# Patient Record
Sex: Female | Born: 1950
Health system: Southern US, Community
[De-identification: ages and names within clinical notes are randomized; demographics above are authoritative.]

## PROBLEM LIST (undated history)

## (undated) DIAGNOSIS — R0683 Snoring: Secondary | ICD-10-CM

## (undated) DIAGNOSIS — E039 Hypothyroidism, unspecified: Secondary | ICD-10-CM

## (undated) DIAGNOSIS — Z8249 Family history of ischemic heart disease and other diseases of the circulatory system: Secondary | ICD-10-CM

## (undated) DIAGNOSIS — M1990 Unspecified osteoarthritis, unspecified site: Secondary | ICD-10-CM

## (undated) DIAGNOSIS — E785 Hyperlipidemia, unspecified: Secondary | ICD-10-CM

## (undated) DIAGNOSIS — R9439 Abnormal result of other cardiovascular function study: Secondary | ICD-10-CM

## (undated) DIAGNOSIS — E041 Nontoxic single thyroid nodule: Secondary | ICD-10-CM

## (undated) DIAGNOSIS — K219 Gastro-esophageal reflux disease without esophagitis: Secondary | ICD-10-CM

## (undated) DIAGNOSIS — E119 Type 2 diabetes mellitus without complications: Secondary | ICD-10-CM

## (undated) DIAGNOSIS — H409 Unspecified glaucoma: Secondary | ICD-10-CM

## (undated) DIAGNOSIS — M8580 Other specified disorders of bone density and structure, unspecified site: Secondary | ICD-10-CM

## (undated) DIAGNOSIS — J45909 Unspecified asthma, uncomplicated: Secondary | ICD-10-CM

## (undated) DIAGNOSIS — G4734 Idiopathic sleep related nonobstructive alveolar hypoventilation: Principal | ICD-10-CM

## (undated) DIAGNOSIS — R0789 Other chest pain: Secondary | ICD-10-CM

## (undated) DIAGNOSIS — Z8589 Personal history of malignant neoplasm of other organs and systems: Secondary | ICD-10-CM

## (undated) DIAGNOSIS — R9431 Abnormal electrocardiogram [ECG] [EKG]: Secondary | ICD-10-CM

## (undated) DIAGNOSIS — E079 Disorder of thyroid, unspecified: Secondary | ICD-10-CM

## (undated) HISTORY — PX: KNEE ARTHROSCOPY: SUR90

## (undated) HISTORY — PX: WRIST SURGERY: SHX841

## (undated) HISTORY — DX: Unspecified glaucoma: H40.9

## (undated) HISTORY — PX: TUBAL LIGATION: SHX77

## (undated) HISTORY — DX: Hyperlipidemia, unspecified: E78.5

## (undated) HISTORY — DX: Family history of ischemic heart disease and other diseases of the circulatory system: Z82.49

## (undated) HISTORY — PX: REDUCTION MAMMAPLASTY: SUR839

## (undated) HISTORY — DX: Other specified disorders of bone density and structure, unspecified site: M85.80

## (undated) HISTORY — DX: Snoring: R06.83

## (undated) HISTORY — DX: Hypothyroidism, unspecified: E03.9

## (undated) HISTORY — DX: Nontoxic single thyroid nodule: E04.1

## (undated) HISTORY — PX: TONSILLECTOMY: SUR1361

## (undated) HISTORY — PX: OTHER SURGICAL HISTORY: SHX169

## (undated) HISTORY — DX: Personal history of malignant neoplasm of other organs and systems: Z85.89

## (undated) HISTORY — DX: Abnormal result of other cardiovascular function study: R94.39

## (undated) HISTORY — DX: Other chest pain: R07.89

## (undated) HISTORY — PX: CHOLECYSTECTOMY: SHX55

## (undated) HISTORY — DX: Unspecified osteoarthritis, unspecified site: M19.90

## (undated) HISTORY — DX: Morbid (severe) obesity due to excess calories: E66.01

## (undated) HISTORY — DX: Abnormal electrocardiogram (ECG) (EKG): R94.31

## (undated) HISTORY — DX: Type 2 diabetes mellitus without complications: E11.9

## (undated) HISTORY — DX: Idiopathic sleep related nonobstructive alveolar hypoventilation: G47.34

## (undated) HISTORY — PX: BREAST REDUCTION SURGERY: SHX8

---

## 1997-08-26 ENCOUNTER — Other Ambulatory Visit: Admission: RE | Admit: 1997-08-26 | Discharge: 1997-08-26 | Payer: Self-pay | Admitting: Orthopedic Surgery

## 1997-10-11 ENCOUNTER — Other Ambulatory Visit: Admission: RE | Admit: 1997-10-11 | Discharge: 1997-10-11 | Payer: Self-pay | Admitting: *Deleted

## 1998-11-18 ENCOUNTER — Other Ambulatory Visit: Admission: RE | Admit: 1998-11-18 | Discharge: 1998-11-18 | Payer: Self-pay | Admitting: Obstetrics and Gynecology

## 1999-04-06 ENCOUNTER — Encounter: Admission: RE | Admit: 1999-04-06 | Discharge: 1999-04-06 | Payer: Self-pay | Admitting: Gastroenterology

## 1999-04-06 ENCOUNTER — Encounter: Payer: Self-pay | Admitting: Gastroenterology

## 1999-10-14 ENCOUNTER — Encounter: Admission: RE | Admit: 1999-10-14 | Discharge: 1999-10-14 | Payer: Self-pay | Admitting: Internal Medicine

## 1999-10-14 ENCOUNTER — Encounter: Payer: Self-pay | Admitting: Internal Medicine

## 1999-10-22 ENCOUNTER — Encounter: Admission: RE | Admit: 1999-10-22 | Discharge: 1999-10-22 | Payer: Self-pay | Admitting: Internal Medicine

## 1999-10-22 ENCOUNTER — Encounter: Payer: Self-pay | Admitting: Internal Medicine

## 1999-10-28 ENCOUNTER — Encounter: Admission: RE | Admit: 1999-10-28 | Discharge: 1999-10-28 | Payer: Self-pay | Admitting: Internal Medicine

## 1999-10-28 ENCOUNTER — Encounter: Payer: Self-pay | Admitting: Internal Medicine

## 1999-12-11 ENCOUNTER — Other Ambulatory Visit: Admission: RE | Admit: 1999-12-11 | Discharge: 1999-12-11 | Payer: Self-pay | Admitting: Obstetrics and Gynecology

## 2000-03-16 ENCOUNTER — Encounter (INDEPENDENT_AMBULATORY_CARE_PROVIDER_SITE_OTHER): Payer: Self-pay

## 2000-03-16 ENCOUNTER — Other Ambulatory Visit: Admission: RE | Admit: 2000-03-16 | Discharge: 2000-03-16 | Payer: Self-pay | Admitting: Obstetrics and Gynecology

## 2000-07-25 ENCOUNTER — Encounter: Admission: RE | Admit: 2000-07-25 | Discharge: 2000-10-23 | Payer: Self-pay | Admitting: Internal Medicine

## 2000-07-25 ENCOUNTER — Encounter (INDEPENDENT_AMBULATORY_CARE_PROVIDER_SITE_OTHER): Payer: Self-pay | Admitting: Specialist

## 2000-08-02 ENCOUNTER — Encounter: Admission: RE | Admit: 2000-08-02 | Discharge: 2000-08-02 | Payer: Self-pay | Admitting: Internal Medicine

## 2000-08-02 ENCOUNTER — Encounter: Payer: Self-pay | Admitting: Internal Medicine

## 2000-08-23 ENCOUNTER — Ambulatory Visit (HOSPITAL_COMMUNITY): Admission: RE | Admit: 2000-08-23 | Discharge: 2000-08-23 | Payer: Self-pay | Admitting: Gastroenterology

## 2000-09-01 ENCOUNTER — Ambulatory Visit (HOSPITAL_COMMUNITY): Admission: RE | Admit: 2000-09-01 | Discharge: 2000-09-01 | Payer: Self-pay | Admitting: *Deleted

## 2000-09-16 ENCOUNTER — Ambulatory Visit (HOSPITAL_COMMUNITY): Admission: RE | Admit: 2000-09-16 | Discharge: 2000-09-16 | Payer: Self-pay | Admitting: Obstetrics and Gynecology

## 2000-09-16 ENCOUNTER — Encounter (INDEPENDENT_AMBULATORY_CARE_PROVIDER_SITE_OTHER): Payer: Self-pay

## 2001-10-16 ENCOUNTER — Encounter: Payer: Self-pay | Admitting: Internal Medicine

## 2001-10-16 ENCOUNTER — Encounter: Admission: RE | Admit: 2001-10-16 | Discharge: 2001-10-16 | Payer: Self-pay | Admitting: Internal Medicine

## 2002-01-25 ENCOUNTER — Encounter: Admission: RE | Admit: 2002-01-25 | Discharge: 2002-01-25 | Payer: Self-pay | Admitting: Internal Medicine

## 2002-01-25 ENCOUNTER — Encounter: Payer: Self-pay | Admitting: Internal Medicine

## 2003-07-09 ENCOUNTER — Observation Stay (HOSPITAL_COMMUNITY): Admission: EM | Admit: 2003-07-09 | Discharge: 2003-07-10 | Payer: Self-pay | Admitting: Emergency Medicine

## 2003-07-15 ENCOUNTER — Other Ambulatory Visit: Admission: RE | Admit: 2003-07-15 | Discharge: 2003-07-15 | Payer: Self-pay | Admitting: Obstetrics and Gynecology

## 2003-07-16 ENCOUNTER — Encounter: Admission: RE | Admit: 2003-07-16 | Discharge: 2003-07-16 | Payer: Self-pay | Admitting: Internal Medicine

## 2003-07-25 ENCOUNTER — Encounter: Admission: RE | Admit: 2003-07-25 | Discharge: 2003-07-25 | Payer: Self-pay | Admitting: Gastroenterology

## 2003-12-26 ENCOUNTER — Encounter: Admission: RE | Admit: 2003-12-26 | Discharge: 2003-12-26 | Payer: Self-pay | Admitting: Sports Medicine

## 2004-05-07 ENCOUNTER — Ambulatory Visit (HOSPITAL_COMMUNITY): Admission: RE | Admit: 2004-05-07 | Discharge: 2004-05-07 | Payer: Self-pay | Admitting: Orthopedic Surgery

## 2004-05-07 ENCOUNTER — Ambulatory Visit (HOSPITAL_BASED_OUTPATIENT_CLINIC_OR_DEPARTMENT_OTHER): Admission: RE | Admit: 2004-05-07 | Discharge: 2004-05-07 | Payer: Self-pay | Admitting: Orthopedic Surgery

## 2004-05-07 ENCOUNTER — Encounter (INDEPENDENT_AMBULATORY_CARE_PROVIDER_SITE_OTHER): Payer: Self-pay | Admitting: *Deleted

## 2004-07-16 ENCOUNTER — Other Ambulatory Visit: Admission: RE | Admit: 2004-07-16 | Discharge: 2004-07-16 | Payer: Self-pay | Admitting: Obstetrics and Gynecology

## 2005-11-02 ENCOUNTER — Encounter: Admission: RE | Admit: 2005-11-02 | Discharge: 2005-11-02 | Payer: Self-pay | Admitting: Orthopedic Surgery

## 2005-11-12 ENCOUNTER — Encounter: Admission: RE | Admit: 2005-11-12 | Discharge: 2005-11-12 | Payer: Self-pay | Admitting: Orthopedic Surgery

## 2006-03-29 ENCOUNTER — Other Ambulatory Visit: Admission: RE | Admit: 2006-03-29 | Discharge: 2006-03-29 | Payer: Self-pay | Admitting: Obstetrics and Gynecology

## 2006-12-06 ENCOUNTER — Encounter: Admission: RE | Admit: 2006-12-06 | Discharge: 2006-12-06 | Payer: Self-pay | Admitting: Orthopaedic Surgery

## 2006-12-09 ENCOUNTER — Encounter: Admission: RE | Admit: 2006-12-09 | Discharge: 2006-12-09 | Payer: Self-pay | Admitting: Orthopaedic Surgery

## 2007-04-05 ENCOUNTER — Other Ambulatory Visit: Admission: RE | Admit: 2007-04-05 | Discharge: 2007-04-05 | Payer: Self-pay | Admitting: Obstetrics and Gynecology

## 2007-04-12 ENCOUNTER — Encounter: Admission: RE | Admit: 2007-04-12 | Discharge: 2007-04-12 | Payer: Self-pay | Admitting: Internal Medicine

## 2007-06-14 ENCOUNTER — Encounter: Admission: RE | Admit: 2007-06-14 | Discharge: 2007-06-14 | Payer: Self-pay | Admitting: Obstetrics and Gynecology

## 2008-04-24 ENCOUNTER — Other Ambulatory Visit: Admission: RE | Admit: 2008-04-24 | Discharge: 2008-04-24 | Payer: Self-pay | Admitting: Obstetrics and Gynecology

## 2008-06-19 ENCOUNTER — Encounter: Admission: RE | Admit: 2008-06-19 | Discharge: 2008-06-19 | Payer: Self-pay | Admitting: Obstetrics and Gynecology

## 2008-06-25 ENCOUNTER — Encounter: Admission: RE | Admit: 2008-06-25 | Discharge: 2008-06-25 | Payer: Self-pay | Admitting: Obstetrics and Gynecology

## 2008-12-26 ENCOUNTER — Encounter: Admission: RE | Admit: 2008-12-26 | Discharge: 2008-12-26 | Payer: Self-pay | Admitting: Obstetrics and Gynecology

## 2009-03-27 ENCOUNTER — Ambulatory Visit (HOSPITAL_BASED_OUTPATIENT_CLINIC_OR_DEPARTMENT_OTHER): Admission: RE | Admit: 2009-03-27 | Discharge: 2009-03-27 | Payer: Self-pay | Admitting: Orthopaedic Surgery

## 2009-06-11 ENCOUNTER — Other Ambulatory Visit: Admission: RE | Admit: 2009-06-11 | Discharge: 2009-06-11 | Payer: Self-pay | Admitting: Obstetrics and Gynecology

## 2009-07-09 ENCOUNTER — Encounter: Admission: RE | Admit: 2009-07-09 | Discharge: 2009-07-09 | Payer: Self-pay | Admitting: Obstetrics and Gynecology

## 2009-12-17 ENCOUNTER — Encounter: Admission: RE | Admit: 2009-12-17 | Discharge: 2009-12-17 | Payer: Self-pay | Admitting: Obstetrics and Gynecology

## 2010-06-17 ENCOUNTER — Other Ambulatory Visit (HOSPITAL_COMMUNITY)
Admission: RE | Admit: 2010-06-17 | Discharge: 2010-06-17 | Disposition: A | Discharge status: Home / Self Care | Payer: PRIVATE HEALTH INSURANCE | Source: Ambulatory Visit | Attending: Obstetrics and Gynecology | Admitting: Obstetrics and Gynecology

## 2010-06-17 ENCOUNTER — Other Ambulatory Visit: Payer: Self-pay | Admitting: Obstetrics and Gynecology

## 2010-06-17 DIAGNOSIS — Z01419 Encounter for gynecological examination (general) (routine) without abnormal findings: Secondary | ICD-10-CM | POA: Insufficient documentation

## 2010-09-25 NOTE — Op Note (Signed)
NAMEORETA, SOLOWAY             ACCOUNT NO.:  [id number]   MEDICAL RECORD NO.:  [id number]          PATIENT TYPE:  AMB   LOCATION:  DSC                          FACILITY:  [facility]   PHYSICIAN:  Cindee Salt, M.D.       DATE OF BIRTH:  11/18/1950   DATE OF PROCEDURE:  05/07/2004  DATE OF DISCHARGE:                                 OPERATIVE REPORT   PREOPERATIVE DIAGNOSIS:  Lesion proximal phalanx, right thumb.   POSTOPERATIVE DIAGNOSIS:  Lesion proximal phalanx, right thumb.   OPERATION:  Incision of lesion and curette with bone graft, distal radius  right thumb, proximal phalanx.   SURGEON:  Cindee Salt, M.D. __________.   ANESTHESIA:  Axillary general.   HISTORY:  The patient is a 60 year old female with a long history of pain at  the metacarpophalangeal joint of her right thumb.  X-rays reveal a  questionable lesion.  MRI reveals a probable cystic lesion, possible  enchondroma.   DESCRIPTION OF PROCEDURE:  The patient is brought to the operating room,  where an axillary block was carried out without difficulty.  She was prepped  using DuraPrep.  A general anesthetic was then given in that she did not  have complete anesthesia.  A volar Bruner-type incision was made over the  metacarpophalangeal joint of the right thumb, carried down through  subcutaneous tissue.  Bleeders were electrocauterized.  The dissection  carried down to the proximal phalanx, an incision made, and the radial side  of the A-1 pulley was released.  The flexor sheath was then elevated off  from the bone.  Maintaining this, drill holes were placed.  A window was  opened.  A large cystic cavity was immediately encountered.  This was  curetted.  A separate incision was then made transversely at the distal  radial styloid, carried down through subcutaneous tissue; radial nerve was  identified and protected.  Dissection carried down between the first and  second dorsal compartment, an incision made, drill holes  placed.  These were  connected with an osteotome, a window opened, and a large bone graft was  then taken and placed into the proximal phalanx.  X-rays confirmed  positioning of the cyst prior to the bone graft using radiopaque material.  This was removed, the bone graft placed.  X-rays confirmed complete filling  of the entire cavity.  Wounds were irrigated.  The subcutaneous tissue and  periosteum closed in the distal radius with 4-0 Vicryl sutures; subcutaneous  4-0 Vicryl was placed for skin closure. The skin on the thumb was closed  with interrupted 5-0 nylon sutures.  A sterile compressive dressing, thumb  spike, and splint applied.  The patient tolerated the procedure well and was  taken to the recovery room for observation in satisfactory condition to be  discharged home to return in approximately one week on Percocet.       GK/MEDQ  D:  05/07/2004  T:  05/07/2004  Job:  [id number]

## 2010-09-25 NOTE — Op Note (Signed)
[facility]  Patient:    [name], [name]                 MRN: [id number] Proc. Date: 09/16/00 Adm. Date:  [id number] Attending:  Silverio Lay A                           Operative Report  PREOPERATIVE DIAGNOSIS:       Postmenopausal bleeding.  POSTOPERATIVE DIAGNOSIS:      Postmenopausal bleeding.  PROCEDURE:                    Diagnostic hysteroscopy with fraction dilation                               and curettage.  SURGEON:                      Silverio Lay, M.D.  ANESTHESIA:                   MAC plus paracervical block.  ESTIMATED BLOOD LOSS:         Minimal.  DESCRIPTION OF PROCEDURE:     After being informed of the planned procedure with possible complications of bleeding, infection, uterine perforation with possibility of bowel injury, need for laparoscopy, laparotomy, informed consent was obtained. The patient was take to OR#3 and provided with IV sedation. She was placed in the lithotomy position, prepped and draped in a sterile fashion, and her bladder was emptied with a Foley catheter.  GYN exam revealed an anteverted uterus, normal size. Adnexa were not palpable due to patients body habitus. A weighted speculum was inserted and the anterior lip of the cervix was grasped with the tenaculum forcep. We proceeded with a paracervical block using Nesacaine 1%, 10 cc, in the usual fashion. The uterus was then sounded at 8 cm.  We proceeded with dilatation of the cervical canal using Hegar dilators at #27. This allowed [place] to safely enter the uterine cavity with the hysteroscope. Perfusion of Sorbitol 3% at 70-90 mmHg. We could then visualize the entire uterine cavity which showed an overall thin endometrium, no abnormal vessels, no polyp, both tubal ostia were well visualized and normal. We then removed the hysteroscope and proceeded with systematic sharp curettage of the uterine cavity which brought only a small amount of  normal-appearing endometrium. This was done after endocervical curettage which also brought a minimum amount of normal-appearing endocervical canal curettage.  Instruments were then all removed. Instrument and sponge count was complete x 2. The procedure was very tolerated by the patient who is taken to recovery room in a well and stable condition. DD:  09/16/00 TD:  09/16/00 Job: [id number]

## 2010-09-25 NOTE — Cardiovascular Report (Signed)
[facility]  Patient:    [name], [name]                 MRN: [id number] Proc. Date: 09/01/00 Adm. Date:  [id number] Attending:  Meade Maw A CC:         Darius Bump, M.D.   Cardiac Catheterization  REFERRING PHYSICIAN:  Darius Bump, M.D.  INDICATIONS FOR PROCEDURE:  Ongoing chest pain with decreased uptake in the anterior and inferior wall without evidence of reversibility and normal wall motion.  Recent upper endoscopy and GI work-up has been negative for explantation of the chest pain, though she continues to have persistent episodes of chest pain without etiologies to explain her symptoms.  Risks, benefits, and options were discussed.  It was elected to proceed with left heart catheterization.  DESCRIPTION OF PROCEDURE:  After obtaining written informed consent, the patient was brought to the cardiac catheterization lab in the postabsorptive state.  Preoperative sedation was achieved using IV Versed.  The right groin was prepped and draped in the usual sterile fashion.  Local anesthesia was achieved using 1% Xylocaine.  A 6 French hemostasis sheath was placed into the right femoral artery using the modified Seldinger technique.  Selective coronary angiography was performed using a JL4, JR4 Judkins catheter. Multiple views were obtained.  The ______ was exchanged over a guidewire. The hemostasis sheath was placed following each catheter exchange.  Following review of films there was no identifiable disease.  FINDINGS:  Aortic pressure is 118/69, LV pressure is 119/8.  Single plane ventriculogram revealed normal wall motion with an ejection fraction at 65%. There was no mitral regurgitation noted.  Left main coronary artery was short, bifurcated into the left anterior descending and circumflex.  There was no significant disease in the left main coronary artery.  Left anterior descending:  The left anterior descending  gave rise to a moderate diagonal #1, moderate diagonal #2 and went on to end prior to the apex.  There was no significant disease in the left anterior descending or its branches.  The circumflex vessel was a large codominant vessel giving rise to a large OM-1, small OM-2 and a large PDA.  There was no significant disease in the circumflex or its branches.  Right coronary artery was codominant.  Moderate RV marginal #1, large RV marginal #2 and a small PDA.  There was no significant disease in the right coronary artery or its branches.  IMPRESSION:  Normal coronary angiography and normal single plane ventriculogram.  No coronary artery disease to explain the etiology of her chest pain.  Decreased uptake on the anterior and inferior wall, most likely explainable by the patients body habitus. DD:  09/01/00 TD:  09/01/00 Job: [id number]

## 2010-09-25 NOTE — H&P (Signed)
[name], [name]                       ACCOUNT NO.:  [id number]   MEDICAL RECORD NO.:  [id number]                   PATIENT TYPE:  INP   LOCATION:  1830                                 FACILITY:  [facility]   PHYSICIAN:  Darius Bump, M.D.             DATE OF BIRTH:  1950-12-11   DATE OF ADMISSION:  07/09/2003  DATE OF DISCHARGE:                                HISTORY & PHYSICAL   IDENTIFYING INFORMATION/JUSTIFICATION FOR ADMISSION AND CARE:  A 60 year old  woman with chest pressure.   HISTORY OF PRESENT ILLNESS:  [name] is a delightful 60 year old  woman with a history of diabetes, hyperlipidemia, who is being admitted with  an episode of chest pressure.  She reports that she awoke Monday a.m. with  chest pressure.  Took Maalox, did not really improve, but after two hours  felt better.  Did feel short of breath with that pain.  This morning again  woke up at 2 a.m. with a feeling of chest pressure or epigastric pressure,  feels a tightness across her chest, may have felt a little short of breath,  felt nauseated initially.  Took Maalox again and Aciphex but did not feel  relief.  Feels that possibly her symptoms are worse with activity.  She has  not felt lightheaded or dizzy.  She has burped but this has not lead to any  relief.  Due to continued symptoms in the office, she was given  nitroglycerin x1 which improved her symptoms.  There did seem to be some  positional component that she feels better sitting up than laying down.  Chest pain was improved but not gone with one nitroglycerin, so a second was  given.  She did feel somewhat lightheaded after the second nitroglycerin and  blood pressure went from 114 to 96.  She was not tachycardic.   She does have a history of a different chest pain in 2002 which lead to a  cardiac work-up. She had a Cardiolite that did show decreased uptake in the  anterior and inferior wall and subsequently underwent a cardiac  catheterization which showed normal coronary arteries.   PAST MEDICAL HISTORY:  1. Diabetes mellitus since 2002, diet controlled until 2004, now on     Glucophage.  2. Hyperlipidemia.  3. GERD with an EGD in 2002.  4. Recent palpitations.  Holter was negative with resolution of symptoms.     Questionable etiology.  5. Atypical chest pain status post stress Cardiolite which was positive but     negative subsequent catheterization.  6. Hypothyroidism with a history of neck radiation as a child.   PAST SURGICAL HISTORY:  1. Breast reduction surgery.  2. Bilateral tubal ligation.  3. Tonsillectomy.  4. Cholecystectomy.   MEDICATIONS:  1. Synthroid 200 mcg a day alternating with 175 mcg a day.  2. Glucophage XR 500 mg three p.o. q.h.s.  3. Zocor 20.  4. Aciphex 20.  5. Celebrex which she is now off of.  6. Calcium.   ALLERGIES:  SULFA.   FAMILY HISTORY:  Mother had breast cancer in her 69s.  Father had type 2  diabetes.   SOCIAL HISTORY:  She is married.  She works at [employer] as a  Diplomatic Services operational officer.  Her husband is a Building services engineer.  Grown children.  No tobacco.  Occasional  alcohol.   REVIEW OF SYMPTOMS:  No fever or chills.  No new cough.  No malaise.  Has  had decreased appetite over the last day.   PHYSICAL EXAMINATION:  GENERAL APPEARANCE:  A pleasant woman in no acute  distress.  VITAL SIGNS:  Blood pressure 114/72 on arrival, temperature 98.6, heart rate  88, weight 274 which is down from 279 (after nitroglycerin, systolic blood  pressure was 96/70).  HEENT:  Pupils are equal, round and reactive to light. Oropharynx is  unremarkable.  NECK:  Thick without JVD appreciated.  LUNGS:  Decreased breath sounds throughout probably due to body habitus.  No  rales or rhonchi are heard.  CARDIOVASCULAR :  Distant due to body habitus but regular.  No murmur or  gallop, no rub is heard.  ABDOMEN:  Soft with normal bowel sounds, no masses, no bruits.  EXTREMITIES:  Trace edema.   Pulses in her upper and lower extremities are 2+  and symmetric.  Blood pressure in her upper extremities is symmetric.   EKG shows sinus rhythm.  There is low voltage which has been seen in the  past but no acute STT changes.   ASSESSMENT:  A 60 year old woman with cardiac risk factors including family  history of diabetes and hyperlipidemia and obesity who has had atypical  chest pain worrisome for angina despite having had a negative cardiac work-  up in 2002.  Less likely cause of pain could be gastrointestinal.   PLAN:  Admit, rule out for myocardial infarction.  Place her on a proton  pump inhibitors and obtain a cardiology consultation.  Ambulance was called  for transport of patient to hospital and she is agreeable.  I did write that  she could get further nitroglycerin p.r.n. chest pain but blood pressure  would have to be monitored closely and she may need IV fluid.  In addition  for her diabetes, will hold the Glucophage in anticipation of a possible  cardiac evaluation and place her on sliding scale insulin as needed.                                                Darius Bump, M.D.    MJM/MEDQ  D:  07/09/2003  T:  07/09/2003  Job:  [id number]

## 2010-09-25 NOTE — Consult Note (Signed)
[name], [name]                       ACCOUNT NO.:  [id number]   MEDICAL RECORD NO.:  [id number]                   PATIENT TYPE:  INP   LOCATION:  2009                                 FACILITY:  [facility]   PHYSICIAN:  Armanda Magic, M.D.                  DATE OF BIRTH:  [birth year]   DATE OF CONSULTATION:  07/09/2003  DATE OF DISCHARGE:                                   CONSULTATION   REFERRED BY:  Darius Bump, M.D.   PRIMARY CARDIOLOGIST:  Meade Maw, M.D.   CHIEF COMPLAINT:  Chest pain.   HISTORY OF PRESENT ILLNESS:  This is a 60 year old female who was admitted  today with complaints of chest pain.  Apparently, had some chest pressure  intermittently over the past few days.  Yesterday, pain reoccurred around  5:30.  She took Aciphex and Maalox with relief.  She went to bed and then  the pain awoke her from sleep around 2:30 a.m. this morning.  Pain was  described as a very severe pain that was a pressure sensation in the  epigastric area radiating under both breasts.  She got no relief with  another Aciphex and Maalox.  Pain improved after elevating her head and  getting her out of bed.  Pain did not worsen with activity.  It was  different from her typical reflux pain and pain she has had previously in  the workup of a cardiac catheterization. She had a catheterization in 2002,  which showed normal coronary arteries.  Pain is only in the epigastrium,  again radiated around both sides into her back.  She had some difficulty  getting a deep breath secondary to increased pain with deep breathing.  She  denies any further radiation of the pain, no nausea, diaphoresis.  She went  to her primary care physician and was given two sublingual nitroglycerin  with some relief.  She still complains of 1/10 awareness of pressure, no  tachy palpitations with chest pain, no shortness of breath.   PAST MEDICAL HISTORY:  1. Hypothyroidism, status post radiation treatment in  childhood.  2. Gastroesophageal reflux, normal EGD in 2002.  3. Non-insulin dependent diabetes mellitus.  4. Asthma.  5. Obesity.  6. Status post cholecystectomy.  7. Status post breast reduction surgery.  8. Hyperlipidemia.  9. History of palpitations, Holter on April 02, 2003, normal sinus     rhythm, heart rate 59 to 130.  No tachy arrhythmias, just sinus     tachycardia.  10.      Stress Cardiolite in April 2002, showed decreased uptake in the     anterior and inferior wall without reversibility, normal LV function, EF     55%.  Cardiac catheterization at the same time showed normal coronary     arteries, EF 55%.   OUTPATIENT MEDICATIONS:  1. Synthroid 200 mcg every other day alternating with 175 mcg every other  day.  2. Zocor 20 mg daily.  3. Aciphex 20 mg daily p.r.n.  4. Glucophage 500 mg in the morning and 1000 mg in the evening.   ALLERGIES:  SULFA.   SOCIAL HISTORY:  She is married with three children.  She works at  [employer] as a Scientist, physiological.  She denies tobacco use, she  occasionally drinks alcohol   FAMILY HISTORY:  Her father has coronary disease and diabetes mellitus.  Her  mother has breast CA.   REVIEW OF SYSTEMS:  Occasional tachy palpitations, one to two hours at a  time.  She is somewhat aware of the palpitations.  She denies any PND or  orthopnea.  Normal bowel habits, no melena, no dizziness.   PHYSICAL EXAMINATION:  VITAL SIGNS:  Blood pressure 115/56, heart rate 115,  respirations 20, she is afebrile, O2 saturation 97% on room air.  GENERAL:  This is a well-developed, well-nourished white female in no acute  distress.  HEENT:  Benign.  NECK:  Supple without lymphadenopathy, no bruits.  LUNGS:  Clear to auscultation throughout.  HEART:  Regular rate and rhythm, no murmurs, rubs, or gallops, normal S1 and  S2.  ABDOMEN:  Soft, nontender, nondistended, with active bowel sounds, no  hepatosplenomegaly.  EXTREMITIES:  No  cyanosis, erythema, or edema.  Good distal pulses.  NEUROLOGIC:  She is alert and oriented x3 with no focal deficits.   LABORATORY DATA:  Sodium 141, potassium 3.9, chloride 105, bicarbonate 29,  BUN 9, creatinine 0.7, glucose 111.  Hemoglobin 13.6, hematocrit 41.5,  platelet count 283, white cell count 10.2.  CPK 53, MB 0.9, troponin less  than 0.01.  LFTs are normal.  TSH pending.  Lipase 23.  Hemoglobin A1C  pending.  EKG showed normal sinus rhythm with nonspecific T-wave inversions  in V1 through V3 which are unchanged from a previous EKG that we have at the  office.   ASSESSMENT:  1. Chest pain with cardiac enzymes negative so far.  EKG with T-wave     inversions in the anterior lead.  No significant change from her most     recent EKG in the office.  Suspect a gastrointestinal etiology, but agree     with serial enzymes.  I have tentatively scheduled for stress Cardiolite     in the morning.  Also, will check a chest CT scan to rule out pulmonary     embolism.  Hold Glucophage for now, given that she will receive contrast,     and give one dose of intravenous Reglan.  Continue proton pump inhibitor.  2. Hypothyroidism.  Continue Synthroid.  3. Hyperlipidemia.  Continue Zocor.   Apparently, the patient was started on Glucophage four months ago and states  that this was around when her palpitations began.  We have seen some people  with atypical chest pain and palpitations taking Glucophage in the past.  May need to consider a different medication.                                               Armanda Magic, M.D.    TT/MEDQ  D:  07/09/2003  T:  07/10/2003  Job:  [id number]   cc:   Meade Maw, M.D.  [address]  [place]  [place] [address]  Fax: [phone]   Darius Bump, M.D.  [address]  [place] [address]  Fax: [phone]

## 2010-11-26 ENCOUNTER — Other Ambulatory Visit: Payer: Self-pay | Admitting: Family Medicine

## 2010-11-26 DIAGNOSIS — Z1231 Encounter for screening mammogram for malignant neoplasm of breast: Secondary | ICD-10-CM

## 2010-12-23 ENCOUNTER — Ambulatory Visit
Admission: RE | Admit: 2010-12-23 | Discharge: 2010-12-23 | Disposition: A | Discharge status: Home / Self Care | Payer: PRIVATE HEALTH INSURANCE | Source: Ambulatory Visit | Attending: Family Medicine | Admitting: Family Medicine

## 2010-12-23 DIAGNOSIS — Z1231 Encounter for screening mammogram for malignant neoplasm of breast: Secondary | ICD-10-CM

## 2011-06-11 HISTORY — PX: OTHER SURGICAL HISTORY: SHX169

## 2011-06-23 ENCOUNTER — Other Ambulatory Visit: Payer: Self-pay | Admitting: Family Medicine

## 2011-06-23 ENCOUNTER — Other Ambulatory Visit (HOSPITAL_COMMUNITY)
Admission: RE | Admit: 2011-06-23 | Discharge: 2011-06-23 | Disposition: A | Discharge status: Home / Self Care | Payer: PRIVATE HEALTH INSURANCE | Source: Ambulatory Visit | Attending: Family Medicine | Admitting: Family Medicine

## 2011-06-23 DIAGNOSIS — Z1159 Encounter for screening for other viral diseases: Secondary | ICD-10-CM | POA: Insufficient documentation

## 2011-06-23 DIAGNOSIS — Z124 Encounter for screening for malignant neoplasm of cervix: Secondary | ICD-10-CM | POA: Insufficient documentation

## 2011-06-28 ENCOUNTER — Other Ambulatory Visit: Payer: Self-pay | Admitting: Gastroenterology

## 2011-07-09 ENCOUNTER — Ambulatory Visit: Payer: PRIVATE HEALTH INSURANCE | Admitting: Lab

## 2011-08-19 ENCOUNTER — Encounter: Payer: Self-pay | Admitting: Genetic Counselor

## 2011-11-30 ENCOUNTER — Other Ambulatory Visit: Payer: Self-pay | Admitting: Family Medicine

## 2011-11-30 DIAGNOSIS — Z1231 Encounter for screening mammogram for malignant neoplasm of breast: Secondary | ICD-10-CM

## 2011-12-29 ENCOUNTER — Ambulatory Visit
Admission: RE | Admit: 2011-12-29 | Discharge: 2011-12-29 | Disposition: A | Discharge status: Home / Self Care | Payer: PRIVATE HEALTH INSURANCE | Source: Ambulatory Visit | Attending: Family Medicine | Admitting: Family Medicine

## 2011-12-29 DIAGNOSIS — Z1231 Encounter for screening mammogram for malignant neoplasm of breast: Secondary | ICD-10-CM

## 2012-01-26 ENCOUNTER — Other Ambulatory Visit: Payer: Self-pay | Admitting: Dermatology

## 2012-05-25 ENCOUNTER — Encounter (HOSPITAL_COMMUNITY): Payer: Self-pay | Admitting: Emergency Medicine

## 2012-05-25 ENCOUNTER — Emergency Department (HOSPITAL_COMMUNITY): Payer: PRIVATE HEALTH INSURANCE

## 2012-05-25 ENCOUNTER — Emergency Department (HOSPITAL_COMMUNITY)
Admission: EM | Admit: 2012-05-25 | Discharge: 2012-05-25 | Disposition: A | Discharge status: Home / Self Care | Payer: PRIVATE HEALTH INSURANCE | Attending: Emergency Medicine | Admitting: Emergency Medicine

## 2012-05-25 DIAGNOSIS — Y9389 Activity, other specified: Secondary | ICD-10-CM | POA: Insufficient documentation

## 2012-05-25 DIAGNOSIS — S199XXA Unspecified injury of neck, initial encounter: Secondary | ICD-10-CM | POA: Insufficient documentation

## 2012-05-25 DIAGNOSIS — J45909 Unspecified asthma, uncomplicated: Secondary | ICD-10-CM | POA: Insufficient documentation

## 2012-05-25 DIAGNOSIS — E079 Disorder of thyroid, unspecified: Secondary | ICD-10-CM | POA: Insufficient documentation

## 2012-05-25 DIAGNOSIS — K219 Gastro-esophageal reflux disease without esophagitis: Secondary | ICD-10-CM | POA: Insufficient documentation

## 2012-05-25 DIAGNOSIS — E119 Type 2 diabetes mellitus without complications: Secondary | ICD-10-CM | POA: Insufficient documentation

## 2012-05-25 DIAGNOSIS — Y92410 Unspecified street and highway as the place of occurrence of the external cause: Secondary | ICD-10-CM | POA: Insufficient documentation

## 2012-05-25 DIAGNOSIS — Z79899 Other long term (current) drug therapy: Secondary | ICD-10-CM | POA: Insufficient documentation

## 2012-05-25 DIAGNOSIS — S0993XA Unspecified injury of face, initial encounter: Secondary | ICD-10-CM | POA: Insufficient documentation

## 2012-05-25 HISTORY — DX: Type 2 diabetes mellitus without complications: E11.9

## 2012-05-25 HISTORY — DX: Gastro-esophageal reflux disease without esophagitis: K21.9

## 2012-05-25 HISTORY — DX: Unspecified asthma, uncomplicated: J45.909

## 2012-05-25 HISTORY — DX: Disorder of thyroid, unspecified: E07.9

## 2012-05-25 MED ORDER — METHOCARBAMOL 500 MG PO TABS: 500.0000 mg | ORAL_TABLET | Freq: Two times a day (BID) | ORAL | Status: DC
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
## 2012-05-25 NOTE — ED Notes (Signed)
Pt returned from radiology.

## 2012-05-25 NOTE — ED Provider Notes (Signed)
Medical screening examination/treatment/procedure(s) were performed by non-physician practitioner and as supervising physician I was immediately available for consultation/collaboration.   Kionna Brier, MD 05/25/12 1418 

## 2012-06-26 ENCOUNTER — Other Ambulatory Visit (HOSPITAL_COMMUNITY): Payer: Self-pay | Admitting: Orthopedic Surgery

## 2012-06-26 DIAGNOSIS — M24139 Other articular cartilage disorders, unspecified wrist: Secondary | ICD-10-CM

## 2012-06-29 ENCOUNTER — Ambulatory Visit (HOSPITAL_COMMUNITY)
Admission: RE | Admit: 2012-06-29 | Discharge: 2012-06-29 | Disposition: A | Discharge status: Home / Self Care | Payer: PRIVATE HEALTH INSURANCE | Source: Ambulatory Visit | Attending: Orthopedic Surgery | Admitting: Orthopedic Surgery

## 2012-06-29 DIAGNOSIS — M25539 Pain in unspecified wrist: Secondary | ICD-10-CM | POA: Insufficient documentation

## 2012-06-29 DIAGNOSIS — IMO0002 Reserved for concepts with insufficient information to code with codable children: Secondary | ICD-10-CM | POA: Insufficient documentation

## 2012-06-29 MED ORDER — IOHEXOL 180 MG/ML  SOLN
5.0000 mL | Freq: Once | INTRAMUSCULAR | Status: AC | PRN
  Administered 2012-06-29: 5 mL via INTRA_ARTICULAR

## 2012-06-29 MED ORDER — GADOBENATE DIMEGLUMINE 529 MG/ML IV SOLN
0.0500 mL | Freq: Once | INTRAVENOUS | Status: AC | PRN
  Administered 2012-06-29: 0.05 mL via INTRAVENOUS

## 2013-01-01 ENCOUNTER — Other Ambulatory Visit: Payer: Self-pay

## 2013-01-01 DIAGNOSIS — Z9889 Other specified postprocedural states: Secondary | ICD-10-CM

## 2013-01-01 DIAGNOSIS — Z1231 Encounter for screening mammogram for malignant neoplasm of breast: Secondary | ICD-10-CM

## 2013-01-10 ENCOUNTER — Ambulatory Visit: Payer: PRIVATE HEALTH INSURANCE

## 2013-01-24 ENCOUNTER — Ambulatory Visit
Admission: RE | Admit: 2013-01-24 | Discharge: 2013-01-24 | Disposition: A | Discharge status: Home / Self Care | Payer: PRIVATE HEALTH INSURANCE | Source: Ambulatory Visit

## 2013-01-24 DIAGNOSIS — Z1231 Encounter for screening mammogram for malignant neoplasm of breast: Secondary | ICD-10-CM

## 2013-01-24 DIAGNOSIS — Z9889 Other specified postprocedural states: Secondary | ICD-10-CM

## 2013-06-13 ENCOUNTER — Other Ambulatory Visit (HOSPITAL_COMMUNITY): Payer: Self-pay | Admitting: Orthopedic Surgery

## 2013-06-13 DIAGNOSIS — M25532 Pain in left wrist: Secondary | ICD-10-CM

## 2013-06-27 ENCOUNTER — Other Ambulatory Visit: Payer: Self-pay | Admitting: Family Medicine

## 2013-06-27 DIAGNOSIS — M8580 Other specified disorders of bone density and structure, unspecified site: Secondary | ICD-10-CM

## 2013-07-02 ENCOUNTER — Ambulatory Visit (HOSPITAL_COMMUNITY)
Admission: RE | Admit: 2013-07-02 | Discharge: 2013-07-02 | Disposition: A | Discharge status: Home / Self Care | Payer: PRIVATE HEALTH INSURANCE | Source: Ambulatory Visit | Attending: Orthopedic Surgery | Admitting: Orthopedic Surgery

## 2013-07-02 ENCOUNTER — Other Ambulatory Visit (HOSPITAL_COMMUNITY): Payer: Self-pay | Admitting: Orthopedic Surgery

## 2013-07-02 DIAGNOSIS — IMO0002 Reserved for concepts with insufficient information to code with codable children: Secondary | ICD-10-CM | POA: Insufficient documentation

## 2013-07-02 DIAGNOSIS — M25532 Pain in left wrist: Secondary | ICD-10-CM

## 2013-07-02 DIAGNOSIS — S638X9A Sprain of other part of unspecified wrist and hand, initial encounter: Principal | ICD-10-CM

## 2013-07-02 DIAGNOSIS — M19039 Primary osteoarthritis, unspecified wrist: Secondary | ICD-10-CM | POA: Insufficient documentation

## 2013-07-02 DIAGNOSIS — S66819A Strain of other specified muscles, fascia and tendons at wrist and hand level, unspecified hand, initial encounter: Principal | ICD-10-CM

## 2013-07-02 DIAGNOSIS — M19049 Primary osteoarthritis, unspecified hand: Secondary | ICD-10-CM | POA: Insufficient documentation

## 2013-07-02 MED ORDER — IOHEXOL 180 MG/ML  SOLN
20.0000 mL | Freq: Once | INTRAMUSCULAR | Status: AC | PRN
  Administered 2013-07-02: 7.5 mL via INTRA_ARTICULAR

## 2013-07-02 MED ORDER — GADOBENATE DIMEGLUMINE 529 MG/ML IV SOLN
5.0000 mL | Freq: Once | INTRAVENOUS | Status: AC | PRN
  Administered 2013-07-02: 0.05 mL via INTRAVENOUS

## 2013-07-25 ENCOUNTER — Ambulatory Visit
Admission: RE | Admit: 2013-07-25 | Discharge: 2013-07-25 | Disposition: A | Discharge status: Home / Self Care | Payer: PRIVATE HEALTH INSURANCE | Source: Ambulatory Visit | Attending: Family Medicine | Admitting: Family Medicine

## 2013-07-25 DIAGNOSIS — M8580 Other specified disorders of bone density and structure, unspecified site: Secondary | ICD-10-CM

## 2013-12-26 ENCOUNTER — Other Ambulatory Visit: Payer: Self-pay | Admitting: Ophthalmology

## 2013-12-26 DIAGNOSIS — H5340 Unspecified visual field defects: Secondary | ICD-10-CM

## 2013-12-27 ENCOUNTER — Other Ambulatory Visit: Payer: Self-pay

## 2013-12-27 DIAGNOSIS — Z9889 Other specified postprocedural states: Secondary | ICD-10-CM

## 2013-12-27 DIAGNOSIS — Z1231 Encounter for screening mammogram for malignant neoplasm of breast: Secondary | ICD-10-CM

## 2014-01-03 ENCOUNTER — Other Ambulatory Visit: Payer: PRIVATE HEALTH INSURANCE

## 2014-01-03 ENCOUNTER — Ambulatory Visit
Admission: RE | Admit: 2014-01-03 | Discharge: 2014-01-03 | Disposition: A | Discharge status: Home / Self Care | Payer: PRIVATE HEALTH INSURANCE | Source: Ambulatory Visit | Attending: Ophthalmology | Admitting: Ophthalmology

## 2014-01-03 DIAGNOSIS — H5340 Unspecified visual field defects: Secondary | ICD-10-CM

## 2014-01-03 MED ORDER — GADOBENATE DIMEGLUMINE 529 MG/ML IV SOLN
20.0000 mL | Freq: Once | INTRAVENOUS | Status: AC | PRN
  Administered 2014-01-03: 20 mL via INTRAVENOUS

## 2014-01-30 ENCOUNTER — Ambulatory Visit: Payer: PRIVATE HEALTH INSURANCE

## 2014-02-06 ENCOUNTER — Ambulatory Visit
Admission: RE | Admit: 2014-02-06 | Discharge: 2014-02-06 | Disposition: A | Discharge status: Home / Self Care | Payer: PRIVATE HEALTH INSURANCE | Source: Ambulatory Visit

## 2014-02-06 ENCOUNTER — Encounter (INDEPENDENT_AMBULATORY_CARE_PROVIDER_SITE_OTHER): Payer: Self-pay

## 2014-02-06 DIAGNOSIS — Z1231 Encounter for screening mammogram for malignant neoplasm of breast: Secondary | ICD-10-CM

## 2014-02-06 DIAGNOSIS — Z9889 Other specified postprocedural states: Secondary | ICD-10-CM

## 2014-03-27 ENCOUNTER — Other Ambulatory Visit: Payer: Self-pay | Admitting: Dermatology

## 2014-07-03 ENCOUNTER — Other Ambulatory Visit: Payer: Self-pay | Admitting: Family Medicine

## 2014-07-03 ENCOUNTER — Other Ambulatory Visit (HOSPITAL_COMMUNITY)
Admission: RE | Admit: 2014-07-03 | Discharge: 2014-07-03 | Disposition: A | Discharge status: Home / Self Care | Payer: PRIVATE HEALTH INSURANCE | Source: Ambulatory Visit | Attending: Family Medicine | Admitting: Family Medicine

## 2014-07-03 DIAGNOSIS — Z1151 Encounter for screening for human papillomavirus (HPV): Secondary | ICD-10-CM | POA: Insufficient documentation

## 2014-07-03 DIAGNOSIS — Z124 Encounter for screening for malignant neoplasm of cervix: Secondary | ICD-10-CM | POA: Insufficient documentation

## 2014-07-04 LAB — CYTOLOGY - PAP

## 2014-11-19 ENCOUNTER — Encounter: Payer: Self-pay | Admitting: Genetic Counselor

## 2015-01-09 ENCOUNTER — Other Ambulatory Visit: Payer: Self-pay

## 2015-01-09 DIAGNOSIS — Z1231 Encounter for screening mammogram for malignant neoplasm of breast: Secondary | ICD-10-CM

## 2015-02-19 ENCOUNTER — Ambulatory Visit: Payer: PRIVATE HEALTH INSURANCE

## 2015-03-05 ENCOUNTER — Ambulatory Visit
Admission: RE | Admit: 2015-03-05 | Discharge: 2015-03-05 | Disposition: A | Discharge status: Home / Self Care | Payer: PRIVATE HEALTH INSURANCE | Source: Ambulatory Visit

## 2015-03-05 DIAGNOSIS — Z1231 Encounter for screening mammogram for malignant neoplasm of breast: Secondary | ICD-10-CM

## 2015-06-02 ENCOUNTER — Other Ambulatory Visit: Payer: Self-pay | Admitting: Family Medicine

## 2015-06-02 DIAGNOSIS — N644 Mastodynia: Secondary | ICD-10-CM

## 2015-06-05 ENCOUNTER — Ambulatory Visit
Admission: RE | Admit: 2015-06-05 | Discharge: 2015-06-05 | Disposition: A | Discharge status: Home / Self Care | Payer: PRIVATE HEALTH INSURANCE | Source: Ambulatory Visit | Attending: Family Medicine | Admitting: Family Medicine

## 2015-06-05 DIAGNOSIS — N644 Mastodynia: Secondary | ICD-10-CM

## 2016-02-10 ENCOUNTER — Other Ambulatory Visit: Payer: Self-pay | Admitting: Family Medicine

## 2016-02-10 DIAGNOSIS — Z1231 Encounter for screening mammogram for malignant neoplasm of breast: Secondary | ICD-10-CM

## 2016-02-19 ENCOUNTER — Other Ambulatory Visit: Payer: Self-pay | Admitting: Occupational Medicine

## 2016-02-19 ENCOUNTER — Ambulatory Visit: Payer: Self-pay

## 2016-02-19 DIAGNOSIS — T189XXA Foreign body of alimentary tract, part unspecified, initial encounter: Secondary | ICD-10-CM

## 2016-03-10 ENCOUNTER — Ambulatory Visit: Payer: PRIVATE HEALTH INSURANCE

## 2016-04-07 ENCOUNTER — Ambulatory Visit
Admission: RE | Admit: 2016-04-07 | Discharge: 2016-04-07 | Disposition: A | Discharge status: Home / Self Care | Payer: PRIVATE HEALTH INSURANCE | Source: Ambulatory Visit | Attending: Family Medicine | Admitting: Family Medicine

## 2016-04-07 DIAGNOSIS — Z1231 Encounter for screening mammogram for malignant neoplasm of breast: Secondary | ICD-10-CM

## 2016-09-01 DIAGNOSIS — L814 Other melanin hyperpigmentation: Secondary | ICD-10-CM | POA: Diagnosis not present

## 2016-09-01 DIAGNOSIS — D485 Neoplasm of uncertain behavior of skin: Secondary | ICD-10-CM | POA: Diagnosis not present

## 2016-09-01 DIAGNOSIS — R9431 Abnormal electrocardiogram [ECG] [EKG]: Secondary | ICD-10-CM | POA: Diagnosis not present

## 2016-09-01 DIAGNOSIS — L918 Other hypertrophic disorders of the skin: Secondary | ICD-10-CM | POA: Diagnosis not present

## 2016-09-01 DIAGNOSIS — E119 Type 2 diabetes mellitus without complications: Secondary | ICD-10-CM | POA: Diagnosis not present

## 2016-09-01 DIAGNOSIS — L821 Other seborrheic keratosis: Secondary | ICD-10-CM | POA: Diagnosis not present

## 2016-09-01 DIAGNOSIS — D2272 Melanocytic nevi of left lower limb, including hip: Secondary | ICD-10-CM | POA: Diagnosis not present

## 2016-09-01 DIAGNOSIS — Z7984 Long term (current) use of oral hypoglycemic drugs: Secondary | ICD-10-CM | POA: Diagnosis not present

## 2016-09-01 DIAGNOSIS — E039 Hypothyroidism, unspecified: Secondary | ICD-10-CM | POA: Diagnosis not present

## 2016-09-01 DIAGNOSIS — D2271 Melanocytic nevi of right lower limb, including hip: Secondary | ICD-10-CM | POA: Diagnosis not present

## 2016-09-01 DIAGNOSIS — Z1159 Encounter for screening for other viral diseases: Secondary | ICD-10-CM | POA: Diagnosis not present

## 2016-09-01 DIAGNOSIS — Z0000 Encounter for general adult medical examination without abnormal findings: Secondary | ICD-10-CM | POA: Diagnosis not present

## 2016-09-01 DIAGNOSIS — E7800 Pure hypercholesterolemia, unspecified: Secondary | ICD-10-CM | POA: Diagnosis not present

## 2016-09-01 DIAGNOSIS — Z6841 Body Mass Index (BMI) 40.0 and over, adult: Secondary | ICD-10-CM | POA: Diagnosis not present

## 2016-09-01 DIAGNOSIS — D225 Melanocytic nevi of trunk: Secondary | ICD-10-CM | POA: Diagnosis not present

## 2016-09-01 DIAGNOSIS — D1801 Hemangioma of skin and subcutaneous tissue: Secondary | ICD-10-CM | POA: Diagnosis not present

## 2016-09-01 DIAGNOSIS — Z85828 Personal history of other malignant neoplasm of skin: Secondary | ICD-10-CM | POA: Diagnosis not present

## 2016-09-03 ENCOUNTER — Telehealth: Payer: Self-pay

## 2016-09-03 NOTE — Telephone Encounter (Signed)
SENT NOTES TO SCHEDULEING

## 2016-09-09 DIAGNOSIS — H401113 Primary open-angle glaucoma, right eye, severe stage: Secondary | ICD-10-CM | POA: Diagnosis not present

## 2016-09-15 ENCOUNTER — Encounter: Payer: Self-pay | Admitting: Cardiology

## 2016-09-23 DIAGNOSIS — H401223 Low-tension glaucoma, left eye, severe stage: Secondary | ICD-10-CM | POA: Diagnosis not present

## 2016-10-06 ENCOUNTER — Encounter: Payer: Self-pay | Admitting: Cardiology

## 2016-10-06 ENCOUNTER — Encounter (INDEPENDENT_AMBULATORY_CARE_PROVIDER_SITE_OTHER): Payer: Self-pay

## 2016-10-06 ENCOUNTER — Ambulatory Visit (INDEPENDENT_AMBULATORY_CARE_PROVIDER_SITE_OTHER): Payer: Medicare Other | Admitting: Cardiology

## 2016-10-06 VITALS — BP 128/72 | HR 74 | Ht 63.5 in | Wt 256.8 lb

## 2016-10-06 DIAGNOSIS — R9431 Abnormal electrocardiogram [ECG] [EKG]: Secondary | ICD-10-CM | POA: Diagnosis not present

## 2016-10-06 DIAGNOSIS — R0683 Snoring: Secondary | ICD-10-CM

## 2016-10-06 DIAGNOSIS — Z8249 Family history of ischemic heart disease and other diseases of the circulatory system: Secondary | ICD-10-CM | POA: Diagnosis not present

## 2016-10-06 HISTORY — DX: Abnormal electrocardiogram (ECG) (EKG): R94.31

## 2016-10-06 HISTORY — DX: Family history of ischemic heart disease and other diseases of the circulatory system: Z82.49

## 2016-10-06 NOTE — Progress Notes (Addendum)
Cardiology Office Note    Date:  10/06/2016   ID:  [name], DOB 12-10-1950, MRN [id number]  PCP:  Aretta Nip, MD  Cardiologist:  [name], MD   Chief Complaint  Patient presents with  . New Evaluation    abnormal EKG, snoring    History of Present Illness:  [name] is a 66 y.o. female who is being seen today for the evaluation of abnormal EKG and concerns for OSA at the request of [name], [name], MD.  The patient was found on routine PE to have an abnormal EKG and there is concern for possible CAD given her multiple CRFs including hyperlipidemia, type 2 DM, post menopausal, family history of CAD and morbid obesity.  She denies any anginal chest pain or pressure.  She does get some SOB with bronchial infections or with allergies but normally does not have SOB, DOE, PND, orthopnea.  She denies any LE edema (except for eating chinese food or high sodium food), dizziness (except with sitting up too fast and some vertigo) or syncope.  She has not had any palptiations.  She says that her husband says that she snores bad at night.  She gets up a lot due to frequent urination at night. Sometimes she wakes up feeling fatigued but she is retired so she sleeps later. She tries not to nap during the day as she is very busy at home.     Past Medical History:  Diagnosis Date  . Asthma   . Atypical chest pain   . Diabetes mellitus without complication ([place])   . DJD (degenerative joint disease)   . GERD (gastroesophageal reflux disease)   . H/O squamous cell carcinoma    NOSE DR. [name]  . Hyperlipidemia   . Hypothyroidism   . Morbid obesity ([place])   . Osteopenia   . Severe stage glaucoma    DR. [name] S/P LASER Rx  . Thyroid disease   . Thyroid nodule    BENIGN    Past Surgical History:  Procedure Laterality Date  . BREAST REDUCTION SURGERY    . CHOLECYSTECTOMY    . KNEE ARTHROSCOPY Left   . S/P COLONOSCOPY  06/2011   HYPERPLASTIC POLYPS  REPEAT 06/2016 DR. [name]  . THYROID NODULE REMOVAL    . TONSILLECTOMY    . TUBAL LIGATION    . WRIST SURGERY Left    JOINT REPAIR    Current Medications: Current Meds  Medication Sig  . brimonidine (ALPHAGAN) 0.2 % ophthalmic solution   . cetirizine (ZYRTEC) 10 MG tablet Take 10 mg by mouth at bedtime.  . cholecalciferol (VITAMIN D) 400 UNITS TABS Take 400 Units by mouth at bedtime.  [name] esomeprazole (NEXIUM) 20 MG capsule Take 20 mg by mouth daily as needed. For indigestion  . fish oil-omega-3 fatty acids 1000 MG capsule Take 1,200 mg by mouth 4 (four) times a week. Sun, Tues, Thurs, Sat   . glimepiride (AMARYL) 4 MG tablet Take 4 mg by mouth 2 (two) times daily.   [name] ibuprofen (ADVIL,MOTRIN) 200 MG tablet Take 200-400 mg by mouth every 8 (eight) hours as needed. For pain  . INVOKANA 100 MG TABS tablet Take 100 mg by mouth daily.   [name] latanoprost (XALATAN) 0.005 % ophthalmic solution 1 drop as directed.   [name] levothyroxine (SYNTHROID, LEVOTHROID) 150 MCG tablet Take 150 mcg by mouth daily.  . metFORMIN (GLUCOPHAGE) 500 MG tablet Take 500 mg by mouth 2 (two) times daily  with a meal.  . methocarbamol (ROBAXIN) 500 MG tablet Take 1 tablet (500 mg total) by mouth 2 (two) times daily. (Patient taking differently: Take 500 mg by mouth as needed. )  . Multiple Vitamin (MULTIVITAMIN WITH MINERALS) TABS Take 1 tablet by mouth at bedtime.  . pregabalin (LYRICA) 50 MG capsule Take 50 mg by mouth daily. Mondays Wednesday and Friday  . simvastatin (ZOCOR) 40 MG tablet Take 40 mg by mouth every Monday, Wednesday, and Friday at 8 PM.    Allergies:   Ciprocinonide [fluocinolone] and Sulfa antibiotics   Social History   Social History  . Marital status: Married    Spouse name: N/A  . Number of children: 3  . Years of education: COLLEGE   Occupational History  . [employer]    Social History Main Topics  . Smoking status: Never Smoker  . Smokeless tobacco: Never Used  . Alcohol use Yes    . Drug use: No  . Sexual activity: Not Asked   Other Topics Concern  . None   Social History Narrative  . None     Family History:  The patient's family history is not on file.   ROS:   Please see the history of present illness.    ROS All other systems reviewed and are negative.  No flowsheet data found.     PHYSICAL EXAM:   VS:  BP 128/72   Pulse 74   Ht 5' 3.5" (1.613 m)   Wt 256 lb 12.8 oz (116.5 kg)   BMI 44.78 kg/m    GEN: Well nourished, well developed, in no acute distress  HEENT: normal  Neck: no JVD, carotid bruits, or masses Cardiac: RRR; no murmurs, rubs, or gallops,no edema.  Intact distal pulses bilaterally.  Respiratory:  clear to auscultation bilaterally, normal work of breathing GI: soft, nontender, nondistended, + BS MS: no deformity or atrophy  Skin: warm and dry, no rash Neuro:  Alert and Oriented x 3, Strength and sensation are intact Psych: euthymic mood, full affect  Wt Readings from Last 3 Encounters:  10/06/16 256 lb 12.8 oz (116.5 kg)      Studies/Labs Reviewed:   EKG:  EKG is ordered today.  The ekg ordered at PCP office was reviewed by me personally and showed NSR with IRRRB and possible anteroseptal infarct.  Repeat EKG today showed NSR with IRBBB and no Q waves.  Recent Labs: No results found for requested labs within last 8760 hours.   Lipid Panel No results found for: CHOL, TRIG, HDL, CHOLHDL, VLDL, LDLCALC, LDLDIRECT  Additional studies/ records that were reviewed today include:  Office notes from PCP    ASSESSMENT:    1. Abnormal EKG   2. Morbid obesity ([place])   3. Family history of early CAD   27. Snoring     PLAN:  In order of problems listed above:  1. Abnormal EKG with anteroseptal infarct and IRBBB - repeat EKG showed no anteroseptal infarct pattern.  She has several CRFs including obesity, DM, hyperlipidemia, post menopausal state and family history of CAD. 2. Morbid obesity 3. Family history of CAD - her  Dad had CAD and DM.  4. Snoring - she thinks that she has OSA.  She has some problems with frequent urination at night and sleeps late to make up for reduced sleep at night.  I will get a home sleep study.     Medication Adjustments/Labs and Tests Ordered: Current medicines are reviewed at length with  the patient today.  Concerns regarding medicines are outlined above.  Medication changes, Labs and Tests ordered today are listed in the Patient Instructions below.  Patient Instructions  Medication Instructions:  Your physician recommends that you continue on your current medications as directed. Please refer to the Current Medication list given to you today.   Labwork: None  Testing/Procedures: Dr. Radford Pax recommends you have a NUCLEAR STRESS TEST.  Dr. Radford Pax recommends you have a [place]. You will be contacted to arrange this.  Follow-Up: Your physician wants you to follow-up in: 1 year with Dr. Radford Pax. You will receive a reminder letter in the mail two months in advance. If you don't receive a letter, please call our office to schedule the follow-up appointment.   Any Other Special Instructions Will Be Listed Below (If Applicable).     If you need a refill on your cardiac medications before your next appointment, please call your pharmacy.      Signed, [name], MD  10/06/2016 9:38 AM    [address], [place], [place]  [address] Phone: [phone]; Fax: [phone]

## 2016-10-06 NOTE — Patient Instructions (Signed)
Medication Instructions:  Your physician recommends that you continue on your current medications as directed. Please refer to the Current Medication list given to you today.   Labwork: None  Testing/Procedures: Dr. Radford Pax recommends you have a NUCLEAR STRESS TEST.  Dr. Radford Pax recommends you have a [place]. You will be contacted to arrange this.  Follow-Up: Your physician wants you to follow-up in: 1 year with Dr. Radford Pax. You will receive a reminder letter in the mail two months in advance. If you don't receive a letter, please call our office to schedule the follow-up appointment.   Any Other Special Instructions Will Be Listed Below (If Applicable).     If you need a refill on your cardiac medications before your next appointment, please call your pharmacy.

## 2016-10-07 ENCOUNTER — Ambulatory Visit (INDEPENDENT_AMBULATORY_CARE_PROVIDER_SITE_OTHER): Payer: Medicare Other | Admitting: Orthopaedic Surgery

## 2016-10-07 ENCOUNTER — Encounter (INDEPENDENT_AMBULATORY_CARE_PROVIDER_SITE_OTHER): Payer: Self-pay | Admitting: Orthopaedic Surgery

## 2016-10-07 ENCOUNTER — Ambulatory Visit (INDEPENDENT_AMBULATORY_CARE_PROVIDER_SITE_OTHER): Payer: Medicare Other

## 2016-10-07 VITALS — BP 110/57 | HR 79 | Resp 80 | Ht 63.5 in | Wt 273.0 lb

## 2016-10-07 DIAGNOSIS — G8929 Other chronic pain: Secondary | ICD-10-CM | POA: Diagnosis not present

## 2016-10-07 DIAGNOSIS — M25551 Pain in right hip: Secondary | ICD-10-CM | POA: Diagnosis not present

## 2016-10-07 DIAGNOSIS — M25561 Pain in right knee: Secondary | ICD-10-CM

## 2016-10-07 MED ORDER — METHYLPREDNISOLONE ACETATE 40 MG/ML IJ SUSP
80.0000 mg | INTRAMUSCULAR | Status: AC | PRN
  Administered 2016-10-07: 80 mg

## 2016-10-07 MED ORDER — LIDOCAINE HCL 1 % IJ SOLN
5.0000 mL | INTRAMUSCULAR | Status: AC | PRN
  Administered 2016-10-07: 5 mL

## 2016-10-07 MED ORDER — BUPIVACAINE HCL 0.5 % IJ SOLN
3.0000 mL | INTRAMUSCULAR | Status: AC | PRN
  Administered 2016-10-07: 3 mL via INTRA_ARTICULAR

## 2016-10-07 NOTE — Progress Notes (Signed)
Office Visit Note   Patient: [name]           Date of Birth: 08-02-1950           MRN: [id number] Visit Date: 10/07/2016              Requested by: [name], [place], [place] [address] PCP: [name]   Assessment & Plan: Visit Diagnoses:  1. Chronic pain of right knee   2. Pain of right hip joint   Moderate osteoarthritis right knee predominantly in the lateral compartment  Plan: cortisone injection right knee, pre cert for visco supplementation  Follow-Up Instructions: No Follow-up on file.   Orders:  Orders Placed This Encounter  Procedures  . XR KNEE 3 VIEW RIGHT  . XR Pelvis 1-2 Views   No orders of the defined types were placed in this encounter.     Procedures: Large Joint Inj Date/Time: 10/07/2016 2:45 PM Performed by: [name] Authorized by: [name]   Consent Given by:  Patient Timeout: prior to procedure the correct patient, procedure, and site was verified   Indications:  Pain and joint swelling Location:  Knee Site:  R knee Prep: patient was prepped and draped in usual sterile fashion   Needle Size:  25 G Needle Length:  1.5 inches Approach:  Anteromedial Ultrasound Guidance: No   Fluoroscopic Guidance: No   Arthrogram: No   Medications:  5 mL lidocaine 1 %; 80 mg methylPREDNISolone acetate 40 MG/ML; 3 mL bupivacaine 0.5 % Aspiration Attempted: No   Patient tolerance:  Patient tolerated the procedure well with no immediate complications     Clinical Data: No additional findings.   Subjective: Chief Complaint  Patient presents with  . Right Knee - Pain    [name] is a 66 y o that presents with Right knee pain x 1 yr. She relates going up and down stairs makes it hurt worse.Aleve and Voltaren gel  Progressive pain over approximatly 1 year without history of injury or trauma. Some popping. Pain predominantly along the medial compartment. She is aware that she  has a limp but does not use ambulatory aid.  HPI  Review of Systems   Objective: Vital Signs: BP (!) 110/57   Pulse 79   Resp (!) 80   Ht 5' 3.5" (1.613 m)   Wt 273 lb (123.8 kg)   BMI 47.60 kg/m   Physical Exam  Ortho Exam increased valgus right knee with weightbearing. No effusion. Mostly medial joint pain. Minimal patellar crepitation. Minimal pain laterally. Full extension over 105 flexion without instability. No popliteal mass or pain. No calf pain. No distal edema.  Specialty Comments:  No specialty comments available.  Imaging: Xr Knee 3 View Right  Result Date: 10/07/2016 3 views of the right knee were obtained standing projection there is approximately 10 of valgus with decrease in the lateral joint space. Peripheral osteophytes were identified both laterally and medially. No ectopic calcification. There are also degenerative changes of the patellofemoral joint as well. Findings  are consistent with moderate osteoarthritis  Xr Pelvis 1-2 Views  Result Date: 10/07/2016 AP of the pelvis was obtained joints appear to be intact. No ectopic calcification. Some irregularity about the greater trochanter both hips but neither are symptomatic. Sclerosis about the sacroiliac joints. Diffuse degenerative changes at L5-S1 which is obviously limited on the single AP view of the pelvis    PMFS History: Patient Active  Problem List   Diagnosis Date Noted  . Abnormal EKG 10/06/2016  . Morbid obesity ([place]) 10/06/2016  . Family history of early CAD 10/06/2016  . Snoring 10/06/2016   Past Medical History:  Diagnosis Date  . Asthma   . Atypical chest pain   . Diabetes mellitus without complication ([place])   . DJD (degenerative joint disease)   . GERD (gastroesophageal reflux disease)   . H/O squamous cell carcinoma    NOSE [name]  . Hyperlipidemia   . Hypothyroidism   . Morbid obesity ([place])   . Osteopenia   . Severe stage glaucoma    [name] S/P LASER Rx  .  Thyroid disease   . Thyroid nodule    BENIGN    History reviewed. No pertinent family history.  Past Surgical History:  Procedure Laterality Date  . BREAST REDUCTION SURGERY    . CHOLECYSTECTOMY    . KNEE ARTHROSCOPY Left   . S/P COLONOSCOPY  06/2011   HYPERPLASTIC POLYPS REPEAT 06/2016 [name]  . THYROID NODULE REMOVAL    . TONSILLECTOMY    . TUBAL LIGATION    . WRIST SURGERY Left    JOINT REPAIR   Social History   Occupational History  . [employer]    Social History Main Topics  . Smoking status: Never Smoker  . Smokeless tobacco: Never Used  . Alcohol use Yes  . Drug use: No  . Sexual activity: Not on file     [name]   Note - This record has been created using [employer].  Chart creation errors have been sought, but may not always  have been located. Such creation errors do not reflect on  the standard of medical care.

## 2016-10-14 ENCOUNTER — Telehealth (HOSPITAL_COMMUNITY): Payer: Self-pay | Admitting: *Deleted

## 2016-10-14 NOTE — Telephone Encounter (Signed)
Patient given detailed instructions per Myocardial Perfusion Study Information Sheet for the test on 10/20/16 at 12:30. Patient notified to arrive 15 minutes early and that it is imperative to arrive on time for appointment to keep from having the test rescheduled.  If you need to cancel or reschedule your appointment, please call the office within 24 hours of your appointment. . Patient verbalized understanding.[name]

## 2016-10-19 ENCOUNTER — Telehealth: Payer: Self-pay | Admitting: *Deleted

## 2016-10-19 NOTE — Telephone Encounter (Signed)
Informed patient of upcoming home sleep study and patient understanding was verbalized. Patient understands she will be contacted by [facility] to set up her sleep study. She understands to call if [facility] does not contact her with new setup in a timely manner. She understands she will be called once confirmation has been received from [facility] that she has received her new machine to schedule 10 week follow up appointment.  Order has been placed in [facility]  She was grateful for the call and thanked me

## 2016-10-20 ENCOUNTER — Ambulatory Visit (HOSPITAL_COMMUNITY): Payer: Medicare Other | Attending: Cardiology

## 2016-10-20 DIAGNOSIS — Z6841 Body Mass Index (BMI) 40.0 and over, adult: Secondary | ICD-10-CM | POA: Insufficient documentation

## 2016-10-20 DIAGNOSIS — R9431 Abnormal electrocardiogram [ECG] [EKG]: Secondary | ICD-10-CM | POA: Insufficient documentation

## 2016-10-20 DIAGNOSIS — Z8249 Family history of ischemic heart disease and other diseases of the circulatory system: Secondary | ICD-10-CM | POA: Diagnosis not present

## 2016-10-20 MED ORDER — TECHNETIUM TC 99M TETROFOSMIN IV KIT
33.0000 | PACK | Freq: Once | INTRAVENOUS | Status: AC | PRN
  Administered 2016-10-20: 33 via INTRAVENOUS
  Filled 2016-10-20: qty 33

## 2016-10-21 ENCOUNTER — Ambulatory Visit (HOSPITAL_COMMUNITY): Payer: Medicare Other | Attending: Internal Medicine

## 2016-10-21 LAB — MYOCARDIAL PERFUSION IMAGING
CSEPEW: 4.6 METS
CSEPHR: 101 %
CSEPPHR: 157 {beats}/min
Exercise duration (min): 4 min
Exercise duration (sec): 15 s
LHR: 0.26
LV sys vol: 24 mL
LVDIAVOL: 75 mL (ref 46–106)
MPHR: 154 {beats}/min
NUC STRESS TID: 0.93
Rest HR: 80 {beats}/min
SDS: 7
SRS: 3
SSS: 10

## 2016-10-21 MED ORDER — TECHNETIUM TC 99M TETROFOSMIN IV KIT
30.8000 | PACK | Freq: Once | INTRAVENOUS | Status: AC | PRN
  Administered 2016-10-21: 30.8 via INTRAVENOUS
  Filled 2016-10-21: qty 31

## 2016-10-25 ENCOUNTER — Encounter: Payer: Self-pay | Admitting: Cardiology

## 2016-10-28 ENCOUNTER — Encounter: Payer: Self-pay | Admitting: Physician Assistant

## 2016-10-28 ENCOUNTER — Ambulatory Visit (INDEPENDENT_AMBULATORY_CARE_PROVIDER_SITE_OTHER): Payer: Medicare Other | Admitting: Physician Assistant

## 2016-10-28 ENCOUNTER — Other Ambulatory Visit: Payer: Self-pay | Admitting: *Deleted

## 2016-10-28 ENCOUNTER — Encounter: Payer: Self-pay | Admitting: *Deleted

## 2016-10-28 VITALS — BP 110/76 | HR 93 | Ht 63.5 in | Wt 257.6 lb

## 2016-10-28 DIAGNOSIS — E119 Type 2 diabetes mellitus without complications: Secondary | ICD-10-CM | POA: Insufficient documentation

## 2016-10-28 DIAGNOSIS — E785 Hyperlipidemia, unspecified: Secondary | ICD-10-CM | POA: Diagnosis not present

## 2016-10-28 DIAGNOSIS — R0683 Snoring: Secondary | ICD-10-CM

## 2016-10-28 DIAGNOSIS — Z8249 Family history of ischemic heart disease and other diseases of the circulatory system: Secondary | ICD-10-CM | POA: Diagnosis not present

## 2016-10-28 DIAGNOSIS — R9439 Abnormal result of other cardiovascular function study: Secondary | ICD-10-CM | POA: Diagnosis not present

## 2016-10-28 DIAGNOSIS — R9431 Abnormal electrocardiogram [ECG] [EKG]: Secondary | ICD-10-CM | POA: Diagnosis not present

## 2016-10-28 HISTORY — DX: Abnormal result of other cardiovascular function study: R94.39

## 2016-10-28 HISTORY — DX: Type 2 diabetes mellitus without complications: E11.9

## 2016-10-28 NOTE — Addendum Note (Signed)
Addended by: Claude Manges on: 10/28/2016 03:06 PM   Modules accepted: Orders

## 2016-10-28 NOTE — Progress Notes (Signed)
Cardiology Office Note    Date:  10/28/2016   ID:  [name], DOB November 30, 1950, MRN [id number]  PCP:  Aretta Nip, MD  Cardiologist: Dr. Radford Pax  Chief Complaint  Patient presents with  . Follow-up    Seen for Dr. Radford Pax    History of Present Illness:  [name] is a 66 y.o. female who was seen as a new patient by Dr. Radford Pax 10/06/16 for evaluation of abnormal EKG and concerns for obstructive sleep apnea. Patient also has hyperlipidemia, type II DM, family history of CAD and morbid obesity. She has no history of chest pain but does have some shortness of breath with bronchial infections. EKG reviewed from PCP showed normal sinus rhythm with incomplete right bundle branch block and possible anterior septal infarct. Repeat EKG in our office normal sinus rhythm with incomplete right bundle branch block and no Q waves.  Nuclear stress test showed upsloping ST segment depression 1 mm during stress with small inferior wall infarct from the apex to the base, no ischemia LVEF 69% with no RWMAs. Dr. Radford Pax reviewed and recommend patient undergo cardiac catheterization to further evaluate. She also ordered sleep apnea test.  Patient here today for cath work up. Had a normal cath 15 yrs ago by Dr. Jeanann Lewandowsky. Denies chest pain, palpitations, dyspnea, dyspnea on exertion, dizziness or presyncope. Exercises at the [facility] riding bike for 30 min.   Past Medical History:  Diagnosis Date  . Asthma   . Atypical chest pain   . Diabetes mellitus without complication ([place])   . DJD (degenerative joint disease)   . GERD (gastroesophageal reflux disease)   . H/O squamous cell carcinoma    NOSE DR. [name]  . Hyperlipidemia   . Hypothyroidism   . Morbid obesity ([place])   . Osteopenia   . Severe stage glaucoma    DR. [name] S/P LASER Rx  . Thyroid disease   . Thyroid nodule    BENIGN    Past Surgical History:  Procedure Laterality Date  . BREAST REDUCTION SURGERY    .  CHOLECYSTECTOMY    . KNEE ARTHROSCOPY Left   . S/P COLONOSCOPY  06/2011   HYPERPLASTIC POLYPS REPEAT 06/2016 DR. [name]  . THYROID NODULE REMOVAL    . TONSILLECTOMY    . TUBAL LIGATION    . WRIST SURGERY Left    JOINT REPAIR    Current Medications: Current Meds  Medication Sig  . brimonidine (ALPHAGAN) 0.2 % ophthalmic solution   . cetirizine (ZYRTEC) 10 MG tablet Take 10 mg by mouth at bedtime.  . cholecalciferol (VITAMIN D) 400 UNITS TABS Take 400 Units by mouth at bedtime.  [name] esomeprazole (NEXIUM) 20 MG capsule Take 20 mg by mouth daily as needed. For indigestion  . fish oil-omega-3 fatty acids 1000 MG capsule Take 1,200 mg by mouth 4 (four) times a week. Sun, Tues, Thurs, Sat   . glimepiride (AMARYL) 4 MG tablet Take 4 mg by mouth 2 (two) times daily.   [name] ibuprofen (ADVIL,MOTRIN) 200 MG tablet Take 200-400 mg by mouth every 8 (eight) hours as needed. For pain  . INVOKANA 100 MG TABS tablet Take 100 mg by mouth daily.   [name] latanoprost (XALATAN) 0.005 % ophthalmic solution 1 drop as directed.   [name] levothyroxine (SYNTHROID, LEVOTHROID) 150 MCG tablet Take 150 mcg by mouth daily.  . metFORMIN (GLUCOPHAGE) 500 MG tablet Take 500 mg by mouth 2 (two) times daily with a meal.  . methocarbamol (  ROBAXIN) 500 MG tablet Take 1 tablet (500 mg total) by mouth 2 (two) times daily. (Patient taking differently: Take 500 mg by mouth as needed. )  . Multiple Vitamin (MULTIVITAMIN WITH MINERALS) TABS Take 1 tablet by mouth at bedtime.  . pregabalin (LYRICA) 50 MG capsule Take 50 mg by mouth daily. Mondays Wednesday and Friday  . simvastatin (ZOCOR) 40 MG tablet Take 40 mg by mouth every Monday, Wednesday, and Friday at 8 PM.     Allergies:   Ciprocinonide [fluocinolone] and Sulfa antibiotics   Social History   Social History  . Marital status: Married    Spouse name: N/A  . Number of children: 3  . Years of education: COLLEGE   Occupational History  . [employer]    Social History  Main Topics  . Smoking status: Never Smoker  . Smokeless tobacco: Never Used  . Alcohol use Yes  . Drug use: No  . Sexual activity: Not Asked   Other Topics Concern  . None   Social History Narrative  . None     Family History:  The patient's   family history includes Heart disease in [name] father. MI and stents in his 8's died in his 54's  ROS:   Please see the history of present illness.    Review of Systems  Constitution: Negative.  HENT: Negative.   Eyes: Negative.   Cardiovascular: Negative.   Respiratory: Negative.   Hematologic/Lymphatic: Negative.   Musculoskeletal: Negative.  Negative for joint pain.  Gastrointestinal: Negative.   Genitourinary: Negative.   Neurological: Negative.    All other systems reviewed and are negative.   PHYSICAL EXAM:   VS:  BP 110/76   Pulse 93   Ht 5' 3.5" (1.613 m)   Wt 257 lb 9.6 oz (116.8 kg)   SpO2 96%   BMI 44.92 kg/m   Physical Exam  GEN: Obese, in no acute distress HEENT: normal Neck: no JVD, carotid bruits, or masses Cardiac:RRR; no murmurs, rubs, or gallops  Respiratory:  clear to auscultation bilaterally, normal work of breathing GI: soft, nontender, nondistended, + BS Ext: without cyanosis, clubbing, or edema, Good distal pulses bilaterally MS: no deformity or atrophy  Skin: warm and dry, no rash Neuro:  Alert and Oriented x 3  Psych: euthymic mood, full affect  Wt Readings from Last 3 Encounters:  10/28/16 257 lb 9.6 oz (116.8 kg)  10/20/16 256 lb (116.1 kg)  10/07/16 273 lb (123.8 kg)      Studies/Labs Reviewed:   EKG:  EKG is not ordered today.     Recent Labs: No results found for requested labs within last 8760 hours.   Lipid Panel No results found for: CHOL, TRIG, HDL, CHOLHDL, VLDL, LDLCALC, LDLDIRECT  Additional studies/ records that were reviewed today include:   Study Highlights     Nuclear stress EF: 69%.  The left ventricular ejection fraction is hyperdynamic (>65%).  Upsloping  ST segment depression ST segment depression of 1 mm was noted during stress in the II, III and aVF leads.  Findings consistent with prior myocardial infarction.  This is a low risk study.   Small inferior wall infarction from apex to base 1 mm ST depression upsloping inferior leads No ischemia  EF 69% no RWMA;s       ASSESSMENT:    1. Abnormal EKG   2. Abnormal nuclear stress test   3. Morbid obesity ([place])   4. Family history of early CAD   32. Snoring  6. Hyperlipidemia, unspecified hyperlipidemia type      PLAN:  In order of problems listed above:  Abnormal nuclear stress test with 1 mm ST depression upsloping inferior leads with small inferior wall infarction from the apex to the base, no ischemia LVEF 69%. Dr. Radford Pax reviewed and recommends cardiac catheterization. Patient does not have any chest pain or cardiac symptoms. She is a agreeable to proceed. Hold metformin 24 hours prior to procedure 48 hours after procedure, hold Amaryl and Invokana the morning of the procedure I have reviewed the risks, indications, and alternatives to angioplasty and stenting with the patient. Risks include but are not limited to bleeding, infection, vascular injury, stroke, myocardial infection, arrhythmia, kidney injury, radiation-related injury in the case of prolonged fluoroscopy use, emergency cardiac surgery, and death. The patient understands the risks of serious complication is low (<9%) and he agrees to proceed.   Abnormal EKG as described above  Morbid obesity weight loss recommended  Family history of CAD with [name] father having an MI and stents in his 21s  Snoring undergoing sleep apnea test for OSA  Hyperlipidemia on simvastatin    Medication Adjustments/Labs and Tests Ordered: Current medicines are reviewed at length with the patient today.  Concerns regarding medicines are outlined above.  Medication changes, Labs and Tests ordered today are listed in the Patient  Instructions below. There are no Patient Instructions on file for this visit.   Sumner Boast, PA-C  10/28/2016 2:34 PM    [facility] [address], [place], [place]  [address] Phone: [phone]; Fax: [phone]

## 2016-10-28 NOTE — Patient Instructions (Signed)
Medication Instructions:    Your physician recommends that you continue on your current medications as directed. Please refer to the Current Medication list given to you today.    If you need a refill on your cardiac medications before your next appointment, please call your pharmacy.  Labwork: CBC W/ DIFF BMET PT/INR   Testing/Procedures:  SEE LETTER FOR LEFT HEART CATH INSTRUCTIONS     Follow-Up: 2 WEEKS POST FOLLOW UP WITH [facility]    Any Other Special Instructions Will Be Listed Below (If Applicable).

## 2016-10-29 LAB — BASIC METABOLIC PANEL
BUN / CREAT RATIO: 30 — AB (ref 12–28)
BUN: 17 mg/dL (ref 8–27)
CO2: 24 mmol/L (ref 20–29)
Calcium: 10.7 mg/dL — ABNORMAL HIGH (ref 8.7–10.3)
Chloride: 100 mmol/L (ref 96–106)
Creatinine, Ser: 0.57 mg/dL (ref 0.57–1.00)
GFR, EST AFRICAN AMERICAN: 112 mL/min/{1.73_m2} (ref >59)
GFR, EST NON AFRICAN AMERICAN: 97 mL/min/{1.73_m2} (ref >59)
Glucose: 82 mg/dL (ref 65–99)
POTASSIUM: 5.1 mmol/L (ref 3.5–5.2)
Sodium: 142 mmol/L (ref 134–144)

## 2016-10-29 LAB — CBC WITH DIFFERENTIAL/PLATELET
BASOS: 0 %
Basophils Absolute: 0 10*3/uL (ref 0.0–0.2)
EOS (ABSOLUTE): 0.3 10*3/uL (ref 0.0–0.4)
EOS: 2 %
HEMATOCRIT: 44.3 % (ref 34.0–46.6)
Hemoglobin: 14.4 g/dL (ref 11.1–15.9)
Immature Grans (Abs): 0 10*3/uL (ref 0.0–0.1)
Immature Granulocytes: 0 %
LYMPHS ABS: 4.1 10*3/uL — AB (ref 0.7–3.1)
Lymphs: 34 %
MCH: 28.9 pg (ref 26.6–33.0)
MCHC: 32.5 g/dL (ref 31.5–35.7)
MCV: 89 fL (ref 79–97)
MONOS ABS: 0.8 10*3/uL (ref 0.1–0.9)
Monocytes: 7 %
NEUTROS ABS: 6.8 10*3/uL (ref 1.4–7.0)
Neutrophils: 57 %
PLATELETS: 283 10*3/uL (ref 150–379)
RBC: 4.99 x10E6/uL (ref 3.77–5.28)
RDW: 14.6 % (ref 12.3–15.4)
WBC: 12 10*3/uL — ABNORMAL HIGH (ref 3.4–10.8)

## 2016-10-29 LAB — PROTIME-INR
INR: 1 (ref 0.8–1.2)
Prothrombin Time: 10.3 s (ref 9.1–12.0)

## 2016-11-01 ENCOUNTER — Telehealth (INDEPENDENT_AMBULATORY_CARE_PROVIDER_SITE_OTHER): Payer: Self-pay | Admitting: *Deleted

## 2016-11-01 NOTE — Telephone Encounter (Signed)
Please call patient and schedule Euflexxa inj. X 3 with Dr. Durward Fortes, Bilateral, buy and bill. Thank you.

## 2016-11-01 NOTE — Telephone Encounter (Signed)
Correction Right knee only.

## 2016-11-02 NOTE — Telephone Encounter (Signed)
IC patient to schedule her Euflexxa injections.  She would like to schedule these when both Dr. Durward Fortes and Aaron Edelman are in the office.  I am unable to put her on Dr. Rudene Anda schedule due to his schedule not being open on the days that she is needing.  I will try and get with Aaron Edelman.

## 2016-11-04 ENCOUNTER — Telehealth: Payer: Self-pay

## 2016-11-04 NOTE — Telephone Encounter (Signed)
Patient contacted pre-catheterization at [facility] scheduled for: 11/05/2016 @ 0730  Verified arrival time and place: NT @ 0530  Confirmed AM meds to be taken pre-cath with sip of water:  Pt states she held Metformin today.  Confirmed Pt holding Amaryl and Invokana tomorrow morning.  Pt states she will take her Synthroid and ASA in am with sip of water.  Confirmed patient has responsible person to drive home post procedure and observe patient for 24 hours: husband  Addl concerns:  None noted.  Pt states she had a cath years ago.  Hopeful they can go through her wrist this time.

## 2016-11-05 ENCOUNTER — Encounter (HOSPITAL_COMMUNITY)
Admission: RE | Disposition: A | Discharge status: Home / Self Care | Payer: Self-pay | Source: Ambulatory Visit | Attending: Interventional Cardiology

## 2016-11-05 ENCOUNTER — Ambulatory Visit (HOSPITAL_COMMUNITY)
Admission: RE | Admit: 2016-11-05 | Discharge: 2016-11-05 | Disposition: A | Discharge status: Home / Self Care | Payer: Medicare Other | Source: Ambulatory Visit | Attending: Interventional Cardiology | Admitting: Interventional Cardiology

## 2016-11-05 DIAGNOSIS — E039 Hypothyroidism, unspecified: Secondary | ICD-10-CM | POA: Insufficient documentation

## 2016-11-05 DIAGNOSIS — J45909 Unspecified asthma, uncomplicated: Secondary | ICD-10-CM | POA: Insufficient documentation

## 2016-11-05 DIAGNOSIS — E785 Hyperlipidemia, unspecified: Secondary | ICD-10-CM | POA: Diagnosis present

## 2016-11-05 DIAGNOSIS — Z9049 Acquired absence of other specified parts of digestive tract: Secondary | ICD-10-CM | POA: Diagnosis not present

## 2016-11-05 DIAGNOSIS — Z9889 Other specified postprocedural states: Secondary | ICD-10-CM | POA: Insufficient documentation

## 2016-11-05 DIAGNOSIS — E119 Type 2 diabetes mellitus without complications: Secondary | ICD-10-CM | POA: Insufficient documentation

## 2016-11-05 DIAGNOSIS — I4510 Unspecified right bundle-branch block: Secondary | ICD-10-CM | POA: Insufficient documentation

## 2016-11-05 DIAGNOSIS — Z6841 Body Mass Index (BMI) 40.0 and over, adult: Secondary | ICD-10-CM | POA: Diagnosis not present

## 2016-11-05 DIAGNOSIS — Z79899 Other long term (current) drug therapy: Secondary | ICD-10-CM | POA: Insufficient documentation

## 2016-11-05 DIAGNOSIS — Z8249 Family history of ischemic heart disease and other diseases of the circulatory system: Secondary | ICD-10-CM | POA: Diagnosis not present

## 2016-11-05 DIAGNOSIS — Z9851 Tubal ligation status: Secondary | ICD-10-CM | POA: Insufficient documentation

## 2016-11-05 DIAGNOSIS — Z7984 Long term (current) use of oral hypoglycemic drugs: Secondary | ICD-10-CM | POA: Diagnosis not present

## 2016-11-05 DIAGNOSIS — Z882 Allergy status to sulfonamides status: Secondary | ICD-10-CM | POA: Diagnosis not present

## 2016-11-05 DIAGNOSIS — M8580 Other specified disorders of bone density and structure, unspecified site: Secondary | ICD-10-CM | POA: Diagnosis not present

## 2016-11-05 DIAGNOSIS — Z85828 Personal history of other malignant neoplasm of skin: Secondary | ICD-10-CM | POA: Diagnosis not present

## 2016-11-05 DIAGNOSIS — K219 Gastro-esophageal reflux disease without esophagitis: Secondary | ICD-10-CM | POA: Diagnosis not present

## 2016-11-05 DIAGNOSIS — Z8719 Personal history of other diseases of the digestive system: Secondary | ICD-10-CM | POA: Diagnosis not present

## 2016-11-05 DIAGNOSIS — M1990 Unspecified osteoarthritis, unspecified site: Secondary | ICD-10-CM | POA: Insufficient documentation

## 2016-11-05 DIAGNOSIS — Z881 Allergy status to other antibiotic agents status: Secondary | ICD-10-CM | POA: Insufficient documentation

## 2016-11-05 DIAGNOSIS — R9439 Abnormal result of other cardiovascular function study: Secondary | ICD-10-CM | POA: Diagnosis not present

## 2016-11-05 HISTORY — PX: LEFT HEART CATH AND CORONARY ANGIOGRAPHY: CATH118249

## 2016-11-05 LAB — GLUCOSE, CAPILLARY: GLUCOSE-CAPILLARY: 156 mg/dL — AB (ref 65–99)

## 2016-11-05 SURGERY — LEFT HEART CATH AND CORONARY ANGIOGRAPHY
Anesthesia: LOCAL

## 2016-11-05 MED ORDER — VERAPAMIL HCL 2.5 MG/ML IV SOLN
INTRAVENOUS | Status: DC | PRN
  Administered 2016-11-05: 10 mL via INTRA_ARTERIAL

## 2016-11-05 MED ORDER — SODIUM CHLORIDE 0.9% FLUSH: 3.0000 mL | INTRAVENOUS | Status: DC | PRN

## 2016-11-05 MED ORDER — IOPAMIDOL (ISOVUE-370) INJECTION 76%
INTRAVENOUS | Status: AC
  Filled 2016-11-05: qty 100

## 2016-11-05 MED ORDER — MIDAZOLAM HCL 2 MG/2ML IJ SOLN
INTRAMUSCULAR | Status: DC | PRN
  Administered 2016-11-05 (×2): 1 mg via INTRAVENOUS

## 2016-11-05 MED ORDER — SODIUM CHLORIDE 0.9 % WEIGHT BASED INFUSION: 1.0000 mL/kg/h | INTRAVENOUS | Status: DC

## 2016-11-05 MED ORDER — LIDOCAINE HCL (PF) 1 % IJ SOLN
INTRAMUSCULAR | Status: DC | PRN
  Administered 2016-11-05: 2 mL

## 2016-11-05 MED ORDER — AMLODIPINE BESYLATE 5 MG PO TABS
ORAL_TABLET | ORAL | Status: AC
  Filled 2016-11-05: qty 2

## 2016-11-05 MED ORDER — SODIUM CHLORIDE 0.9% FLUSH: 3.0000 mL | Freq: Two times a day (BID) | INTRAVENOUS | Status: DC

## 2016-11-05 MED ORDER — ACETAMINOPHEN 325 MG PO TABS
ORAL_TABLET | ORAL | Status: AC
  Administered 2016-11-05: 10:00:00
  Filled 2016-11-05: qty 2

## 2016-11-05 MED ORDER — SODIUM CHLORIDE 0.9 % IV SOLN: 250.0000 mL | INTRAVENOUS | Status: DC | PRN

## 2016-11-05 MED ORDER — ONDANSETRON HCL 4 MG/2ML IJ SOLN: 4.0000 mg | Freq: Four times a day (QID) | INTRAMUSCULAR | Status: DC | PRN

## 2016-11-05 MED ORDER — ACETAMINOPHEN 325 MG PO TABS: 650.0000 mg | ORAL_TABLET | ORAL | Status: DC | PRN

## 2016-11-05 MED ORDER — HEPARIN SODIUM (PORCINE) 1000 UNIT/ML IJ SOLN
INTRAMUSCULAR | Status: AC
  Filled 2016-11-05: qty 1

## 2016-11-05 MED ORDER — OXYCODONE-ACETAMINOPHEN 5-325 MG PO TABS: 1.0000 | ORAL_TABLET | ORAL | Status: DC | PRN

## 2016-11-05 MED ORDER — HEPARIN SODIUM (PORCINE) 1000 UNIT/ML IJ SOLN
INTRAMUSCULAR | Status: DC | PRN
  Administered 2016-11-05: 5500 [IU] via INTRAVENOUS

## 2016-11-05 MED ORDER — SODIUM CHLORIDE 0.9 % WEIGHT BASED INFUSION
3.0000 mL/kg/h | INTRAVENOUS | Status: DC
  Administered 2016-11-05: 3 mL/kg/h via INTRAVENOUS

## 2016-11-05 MED ORDER — HEPARIN (PORCINE) IN NACL 2-0.9 UNIT/ML-% IJ SOLN
INTRAMUSCULAR | Status: AC
  Filled 2016-11-05: qty 500

## 2016-11-05 MED ORDER — VERAPAMIL HCL 2.5 MG/ML IV SOLN
INTRAVENOUS | Status: AC
  Filled 2016-11-05: qty 2

## 2016-11-05 MED ORDER — HEPARIN (PORCINE) IN NACL 2-0.9 UNIT/ML-% IJ SOLN
INTRAMUSCULAR | Status: DC | PRN
  Administered 2016-11-05: 07:00:00

## 2016-11-05 MED ORDER — SODIUM CHLORIDE 0.9 % IV SOLN: INTRAVENOUS | Status: DC

## 2016-11-05 MED ORDER — LIDOCAINE HCL (PF) 1 % IJ SOLN
INTRAMUSCULAR | Status: AC
  Filled 2016-11-05: qty 30

## 2016-11-05 MED ORDER — FENTANYL CITRATE (PF) 100 MCG/2ML IJ SOLN
INTRAMUSCULAR | Status: DC | PRN
  Administered 2016-11-05: 50 ug via INTRAVENOUS

## 2016-11-05 MED ORDER — MIDAZOLAM HCL 2 MG/2ML IJ SOLN
INTRAMUSCULAR | Status: AC
  Filled 2016-11-05: qty 2

## 2016-11-05 MED ORDER — FENTANYL CITRATE (PF) 100 MCG/2ML IJ SOLN
INTRAMUSCULAR | Status: AC
  Filled 2016-11-05: qty 2

## 2016-11-05 MED ORDER — ASPIRIN 81 MG PO CHEW: 81.0000 mg | CHEWABLE_TABLET | ORAL | Status: DC

## 2016-11-05 SURGICAL SUPPLY — 13 items
CATH EXPO 5F FL3.5 (CATHETERS) ×2 IMPLANT
CATH INFINITI JR4 5F (CATHETERS) ×2 IMPLANT
COVER PRB 48X5XTLSCP FOLD TPE (BAG) ×1 IMPLANT
COVER PROBE 5X48 (BAG) ×1
DEVICE RAD COMP TR BAND LRG (VASCULAR PRODUCTS) ×2 IMPLANT
GLIDESHEATH SLEND A-KIT 6F 22G (SHEATH) ×2 IMPLANT
GUIDEWIRE INQWIRE 1.5J.035X260 (WIRE) ×1 IMPLANT
INQWIRE 1.5J .035X260CM (WIRE) ×2
KIT HEART LEFT (KITS) ×2 IMPLANT
PACK CARDIAC CATHETERIZATION (CUSTOM PROCEDURE TRAY) ×2 IMPLANT
TRANSDUCER W/STOPCOCK (MISCELLANEOUS) ×2 IMPLANT
TUBING CIL FLEX 10 FLL-RA (TUBING) ×2 IMPLANT
WIRE HI TORQ VERSACORE-J 145CM (WIRE) ×2 IMPLANT

## 2016-11-05 NOTE — Discharge Instructions (Signed)

## 2016-11-05 NOTE — H&P (View-Only) (Signed)
Cardiology Office Note    Date:  10/28/2016   ID:  [name], DOB Feb 10, 1951, MRN [id number]  PCP:  Aretta Nip, MD  Cardiologist: Dr. Radford Pax  Chief Complaint  Patient presents with  . Follow-up    Seen for Dr. Radford Pax    History of Present Illness:  [name] is a 66 y.o. female who was seen as a new patient by Dr. Radford Pax 10/06/16 for evaluation of abnormal EKG and concerns for obstructive sleep apnea. Patient also has hyperlipidemia, type II DM, family history of CAD and morbid obesity. She has no history of chest pain but does have some shortness of breath with bronchial infections. EKG reviewed from PCP showed normal sinus rhythm with incomplete right bundle branch block and possible anterior septal infarct. Repeat EKG in our office normal sinus rhythm with incomplete right bundle branch block and no Q waves.  Nuclear stress test showed upsloping ST segment depression 1 mm during stress with small inferior wall infarct from the apex to the base, no ischemia LVEF 69% with no RWMAs. Dr. Radford Pax reviewed and recommend patient undergo cardiac catheterization to further evaluate. She also ordered sleep apnea test.  Patient here today for cath work up. Had a normal cath 15 yrs ago by Dr. Jeanann Lewandowsky. Denies chest pain, palpitations, dyspnea, dyspnea on exertion, dizziness or presyncope. Exercises at the [facility] riding bike for 30 min.   Past Medical History:  Diagnosis Date  . Asthma   . Atypical chest pain   . Diabetes mellitus without complication ([place])   . DJD (degenerative joint disease)   . GERD (gastroesophageal reflux disease)   . H/O squamous cell carcinoma    NOSE DR. [name]  . Hyperlipidemia   . Hypothyroidism   . Morbid obesity ([place])   . Osteopenia   . Severe stage glaucoma    DR. [name] S/P LASER Rx  . Thyroid disease   . Thyroid nodule    BENIGN    Past Surgical History:  Procedure Laterality Date  . BREAST REDUCTION SURGERY    .  CHOLECYSTECTOMY    . KNEE ARTHROSCOPY Left   . S/P COLONOSCOPY  06/2011   HYPERPLASTIC POLYPS REPEAT 06/2016 DR. [name]  . THYROID NODULE REMOVAL    . TONSILLECTOMY    . TUBAL LIGATION    . WRIST SURGERY Left    JOINT REPAIR    Current Medications: Current Meds  Medication Sig  . brimonidine (ALPHAGAN) 0.2 % ophthalmic solution   . cetirizine (ZYRTEC) 10 MG tablet Take 10 mg by mouth at bedtime.  . cholecalciferol (VITAMIN D) 400 UNITS TABS Take 400 Units by mouth at bedtime.  [name] esomeprazole (NEXIUM) 20 MG capsule Take 20 mg by mouth daily as needed. For indigestion  . fish oil-omega-3 fatty acids 1000 MG capsule Take 1,200 mg by mouth 4 (four) times a week. Sun, Tues, Thurs, Sat   . glimepiride (AMARYL) 4 MG tablet Take 4 mg by mouth 2 (two) times daily.   [name] ibuprofen (ADVIL,MOTRIN) 200 MG tablet Take 200-400 mg by mouth every 8 (eight) hours as needed. For pain  . INVOKANA 100 MG TABS tablet Take 100 mg by mouth daily.   [name] latanoprost (XALATAN) 0.005 % ophthalmic solution 1 drop as directed.   [name] levothyroxine (SYNTHROID, LEVOTHROID) 150 MCG tablet Take 150 mcg by mouth daily.  . metFORMIN (GLUCOPHAGE) 500 MG tablet Take 500 mg by mouth 2 (two) times daily with a meal.  . methocarbamol (  ROBAXIN) 500 MG tablet Take 1 tablet (500 mg total) by mouth 2 (two) times daily. (Patient taking differently: Take 500 mg by mouth as needed. )  . Multiple Vitamin (MULTIVITAMIN WITH MINERALS) TABS Take 1 tablet by mouth at bedtime.  . pregabalin (LYRICA) 50 MG capsule Take 50 mg by mouth daily. Mondays Wednesday and Friday  . simvastatin (ZOCOR) 40 MG tablet Take 40 mg by mouth every Monday, Wednesday, and Friday at 8 PM.     Allergies:   Ciprocinonide [fluocinolone] and Sulfa antibiotics   Social History   Social History  . Marital status: Married    Spouse name: N/A  . Number of children: 3  . Years of education: COLLEGE   Occupational History  . [employer]    Social History  Main Topics  . Smoking status: Never Smoker  . Smokeless tobacco: Never Used  . Alcohol use Yes  . Drug use: No  . Sexual activity: Not Asked   Other Topics Concern  . None   Social History Narrative  . None     Family History:  The patient's   family history includes Heart disease in her father. MI and stents in his 20's died in his 73's  ROS:   Please see the history of present illness.    Review of Systems  Constitution: Negative.  HENT: Negative.   Eyes: Negative.   Cardiovascular: Negative.   Respiratory: Negative.   Hematologic/Lymphatic: Negative.   Musculoskeletal: Negative.  Negative for joint pain.  Gastrointestinal: Negative.   Genitourinary: Negative.   Neurological: Negative.    All other systems reviewed and are negative.   PHYSICAL EXAM:   VS:  BP 110/76   Pulse 93   Ht 5' 3.5" (1.613 m)   Wt 257 lb 9.6 oz (116.8 kg)   SpO2 96%   BMI 44.92 kg/m   Physical Exam  GEN: Obese, in no acute distress HEENT: normal Neck: no JVD, carotid bruits, or masses Cardiac:RRR; no murmurs, rubs, or gallops  Respiratory:  clear to auscultation bilaterally, normal work of breathing GI: soft, nontender, nondistended, + BS Ext: without cyanosis, clubbing, or edema, Good distal pulses bilaterally MS: no deformity or atrophy  Skin: warm and dry, no rash Neuro:  Alert and Oriented x 3  Psych: euthymic mood, full affect  Wt Readings from Last 3 Encounters:  10/28/16 257 lb 9.6 oz (116.8 kg)  10/20/16 256 lb (116.1 kg)  10/07/16 273 lb (123.8 kg)      Studies/Labs Reviewed:   EKG:  EKG is not ordered today.     Recent Labs: No results found for requested labs within last 8760 hours.   Lipid Panel No results found for: CHOL, TRIG, HDL, CHOLHDL, VLDL, LDLCALC, LDLDIRECT  Additional studies/ records that were reviewed today include:   Study Highlights     Nuclear stress EF: 69%.  The left ventricular ejection fraction is hyperdynamic (>65%).  Upsloping  ST segment depression ST segment depression of 1 mm was noted during stress in the II, III and aVF leads.  Findings consistent with prior myocardial infarction.  This is a low risk study.   Small inferior wall infarction from apex to base 1 mm ST depression upsloping inferior leads No ischemia  EF 69% no RWMA;s       ASSESSMENT:    1. Abnormal EKG   2. Abnormal nuclear stress test   3. Morbid obesity ([place])   4. Family history of early CAD   92. Snoring  6. Hyperlipidemia, unspecified hyperlipidemia type      PLAN:  In order of problems listed above:  Abnormal nuclear stress test with 1 mm ST depression upsloping inferior leads with small inferior wall infarction from the apex to the base, no ischemia LVEF 69%. Dr. Radford Pax reviewed and recommends cardiac catheterization. Patient does not have any chest pain or cardiac symptoms. She is a agreeable to proceed. Hold metformin 24 hours prior to procedure 48 hours after procedure, hold Amaryl and Invokana the morning of the procedure I have reviewed the risks, indications, and alternatives to angioplasty and stenting with the patient. Risks include but are not limited to bleeding, infection, vascular injury, stroke, myocardial infection, arrhythmia, kidney injury, radiation-related injury in the case of prolonged fluoroscopy use, emergency cardiac surgery, and death. The patient understands the risks of serious complication is low (<3%) and he agrees to proceed.   Abnormal EKG as described above  Morbid obesity weight loss recommended  Family history of CAD with her father having an MI and stents in his 42s  Snoring undergoing sleep apnea test for OSA  Hyperlipidemia on simvastatin    Medication Adjustments/Labs and Tests Ordered: Current medicines are reviewed at length with the patient today.  Concerns regarding medicines are outlined above.  Medication changes, Labs and Tests ordered today are listed in the Patient  Instructions below. There are no Patient Instructions on file for this visit.   Sumner Boast, PA-C  10/28/2016 2:34 PM    [facility] [address], [place], [place]  [address] Phone: [phone]; Fax: [phone]

## 2016-11-05 NOTE — Interval H&P Note (Signed)
Cath Lab Visit (complete for each Cath Lab visit)  Clinical Evaluation Leading to the Procedure:   ACS: No.  Non-ACS:    Anginal Classification: CCS III  Anti-ischemic medical therapy: Minimal Therapy (1 class of medications)  Non-Invasive Test Results: Low-risk stress test findings: cardiac mortality <1%/year  Prior CABG: No previous CABG      History and Physical Interval Note:  11/05/2016 7:00 AM  [name]  has presented today for surgery, with the diagnosis of abnormal stress test  The various methods of treatment have been discussed with the patient and family. After consideration of risks, benefits and other options for treatment, the patient has consented to  Procedure(s): Left Heart Cath and Coronary Angiography (N/A) as a surgical intervention .  The patient's history has been reviewed, patient examined, no change in status, stable for surgery.  I have reviewed the patient's chart and labs.  Questions were answered to the patient's satisfaction.     Belva Crome III

## 2016-11-06 ENCOUNTER — Encounter: Payer: Self-pay | Admitting: Cardiology

## 2016-11-06 DIAGNOSIS — G4733 Obstructive sleep apnea (adult) (pediatric): Secondary | ICD-10-CM | POA: Diagnosis not present

## 2016-11-08 ENCOUNTER — Encounter (HOSPITAL_COMMUNITY): Payer: Self-pay | Admitting: Interventional Cardiology

## 2016-11-08 DIAGNOSIS — G4733 Obstructive sleep apnea (adult) (pediatric): Secondary | ICD-10-CM | POA: Diagnosis not present

## 2016-11-09 DIAGNOSIS — G4733 Obstructive sleep apnea (adult) (pediatric): Secondary | ICD-10-CM | POA: Diagnosis not present

## 2016-11-17 NOTE — Telephone Encounter (Signed)
Home Sleep Study received and sent to scan.

## 2016-11-22 ENCOUNTER — Ambulatory Visit (INDEPENDENT_AMBULATORY_CARE_PROVIDER_SITE_OTHER): Payer: Medicare Other | Admitting: Physician Assistant

## 2016-11-22 ENCOUNTER — Encounter: Payer: Self-pay | Admitting: Physician Assistant

## 2016-11-22 VITALS — BP 106/70 | HR 90 | Ht 64.0 in | Wt 255.4 lb

## 2016-11-22 DIAGNOSIS — R0683 Snoring: Secondary | ICD-10-CM | POA: Diagnosis not present

## 2016-11-22 DIAGNOSIS — E118 Type 2 diabetes mellitus with unspecified complications: Secondary | ICD-10-CM

## 2016-11-22 DIAGNOSIS — R9439 Abnormal result of other cardiovascular function study: Secondary | ICD-10-CM

## 2016-11-22 DIAGNOSIS — E785 Hyperlipidemia, unspecified: Secondary | ICD-10-CM

## 2016-11-22 NOTE — Progress Notes (Signed)
Cardiology Office Note    Date:  11/22/2016   ID:  [name], DOB 04-Sep-1950, MRN [id number]  PCP:  Aretta Nip, MD  Cardiologist:   No chief complaint on file.   History of Present Illness:  [name] is a 66 y.o. female who was seen as a new patient by Dr. Radford Pax 10/06/16 for evaluation of abnormal EKG and concerns for obstructive sleep apnea. Patient also has hyperlipidemia, type II DM, family history of CAD and morbid obesity. She has no history of chest pain but does have some shortness of breath with bronchial infections. EKG reviewed from PCP showed normal sinus rhythm with incomplete right bundle branch block and possible anterior septal infarct. Repeat EKG in our office normal sinus rhythm with incomplete right bundle branch block and no Q waves.   Nuclear stress test showed upsloping ST segment depression 1 mm during stress with small inferior wall infarct from the apex to the base, no ischemia LVEF 69% with no RWMAs. Dr. Radford Pax reviewed and recommend patient undergo cardiac catheterization to further evaluate. She also ordered sleep apnea test.   Cardiac catheterization 11/05/16 showed normal coronary arteries and normal LV EF. False positive myocardial perfusion study.  Patient comes in today for post cath follow-up. She denies any chest pain, palpitations, dyspnea, dizziness or presyncope. I also reviewed her sleep apnea test with her which was normal.  Past Medical History:  Diagnosis Date  . Abnormal EKG 10/06/2016  . Abnormal nuclear stress test 10/28/2016  . Asthma   . Atypical chest pain   . Diabetes mellitus without complication ([place])   . DJD (degenerative joint disease)   . DM (diabetes mellitus) ([place]) 10/28/2016  . Family history of early CAD 10/06/2016  . GERD (gastroesophageal reflux disease)   . H/O squamous cell carcinoma    NOSE DR. [name]  . Hyperlipidemia   . Hypothyroidism   . Morbid obesity ([place])   . Osteopenia   . Severe  stage glaucoma    DR. [name] S/P LASER Rx  . Snoring 10/06/2016  . Thyroid disease   . Thyroid nodule    BENIGN    Past Surgical History:  Procedure Laterality Date  . BREAST REDUCTION SURGERY    . CHOLECYSTECTOMY    . KNEE ARTHROSCOPY Left   . LEFT HEART CATH AND CORONARY ANGIOGRAPHY N/A 11/05/2016   Procedure: Left Heart Cath and Coronary Angiography;  Surgeon: Belva Crome, MD;  Location: [facility];  Service: Cardiovascular;  Laterality: N/A;  . S/P COLONOSCOPY  06/2011   HYPERPLASTIC POLYPS REPEAT 06/2016 DR. [name]  . THYROID NODULE REMOVAL    . TONSILLECTOMY    . TUBAL LIGATION    . WRIST SURGERY Left    JOINT REPAIR    Current Medications: Current Meds  Medication Sig  . brimonidine (ALPHAGAN) 0.2 % ophthalmic solution Place 1 drop into both eyes 2 (two) times daily.   . carboxymethylcellulose (REFRESH PLUS) 0.5 % SOLN Place 1 drop into both eyes 3 (three) times daily as needed (for dry eyes).  . cetirizine (ZYRTEC) 10 MG tablet Take 10 mg by mouth at bedtime.  . Cholecalciferol (VITAMIN D-3) 5000 units TABS Take 5,000 Units by mouth every Monday, Wednesday, and Friday.  . diclofenac sodium (VOLTAREN) 1 % GEL Apply 1-2 g topically 3 (three) times daily as needed (for knee pain.).  [name] esomeprazole (NEXIUM) 20 MG capsule Take 20 mg by mouth daily as needed (for acid reflux/indigestion).   [name]  glimepiride (AMARYL) 4 MG tablet Take 4 mg by mouth 2 (two) times daily.   [name] ibuprofen (ADVIL,MOTRIN) 200 MG tablet Take 200-400 mg by mouth every 8 (eight) hours as needed. For pain  . INVOKANA 100 MG TABS tablet Take 100 mg by mouth daily.   [name] latanoprost (XALATAN) 0.005 % ophthalmic solution Place 1 drop into both eyes at bedtime.   [name] levothyroxine (SYNTHROID, LEVOTHROID) 150 MCG tablet Take 150 mcg by mouth daily before breakfast.   . metFORMIN (GLUCOPHAGE) 500 MG tablet Take 1,000 mg by mouth 2 (two) times daily with a meal.   . methocarbamol (ROBAXIN) 500 MG tablet Take 1  tablet (500 mg total) by mouth 2 (two) times daily. (Patient taking differently: Take 500 mg by mouth 2 (two) times daily as needed for muscle spasms. )  . Multiple Vitamin (MULTIVITAMIN WITH MINERALS) TABS Take 1 tablet by mouth at bedtime. CENTRUM  . Omega-3 Fatty Acids (FISH OIL ULTRA) 1400 MG CAPS Take 1,400 mg by mouth every Tuesday, Thursday, Saturday, and Sunday at 6 PM.  . pregabalin (LYRICA) 50 MG capsule Take 50 mg by mouth every Monday, Wednesday, and Friday. In the morning  . simvastatin (ZOCOR) 40 MG tablet Take 40 mg by mouth every Monday, Wednesday, and Friday at 8 PM.     Allergies:   Ciprofloxacin and Sulfa antibiotics   Social History   Social History  . Marital status: Married    Spouse name: N/A  . Number of children: 3  . Years of education: COLLEGE   Occupational History  . [employer]    Social History Main Topics  . Smoking status: Never Smoker  . Smokeless tobacco: Never Used  . Alcohol use Yes  . Drug use: No  . Sexual activity: Not Asked   Other Topics Concern  . None   Social History Narrative  . None     Family History:  The patient's family history includes Heart disease in her father.   ROS:   Please see the history of present illness.    Review of Systems  Constitution: Negative.  HENT: Negative.   Eyes: Negative.   Cardiovascular: Positive for dyspnea on exertion.  Respiratory: Positive for snoring.   Hematologic/Lymphatic: Negative.   Musculoskeletal: Negative.  Negative for joint pain.  Gastrointestinal: Negative.   Genitourinary: Negative.   Neurological: Negative.    All other systems reviewed and are negative.   PHYSICAL EXAM:   VS:  BP 106/70   Pulse 90   Ht 5\' 4"  (1.626 m)   Wt 255 lb 6.4 oz (115.8 kg)   SpO2 99%   BMI 43.84 kg/m   Physical Exam  GEN: Obese, well developed, in no acute distress  Neck: no JVD, carotid bruits, or masses Cardiac:RRR; no murmurs, rubs, or gallops  Respiratory:  clear to  auscultation bilaterally, normal work of breathing GI: soft, nontender, nondistended, + BS Ext: Right arm at cath site without hematoma or hemorrhage, good radial and brachial pulses, lower extremities without cyanosis, clubbing, or edema, Good distal pulses bilaterally Neuro:  Alert and Oriented x 3 Psych: euthymic mood, full affect  Wt Readings from Last 3 Encounters:  11/22/16 255 lb 6.4 oz (115.8 kg)  11/05/16 251 lb (113.9 kg)  10/28/16 257 lb 9.6 oz (116.8 kg)      Studies/Labs Reviewed:   EKG:  EKG is not ordered today.    Recent Labs: 10/28/2016: BUN 17; Creatinine, Ser 0.57; Hemoglobin 14.4; Platelets 283; Potassium 5.1; Sodium  142   Lipid Panel No results found for: CHOL, TRIG, HDL, CHOLHDL, VLDL, LDLCALC, LDLDIRECT  Additional studies/ records that were reviewed today include:  Cardiac cath 11/05/16  Conclusion     The left ventricular ejection fraction is greater than 65% by visual estimate.  LV end diastolic pressure is normal.    Normal coronary arteries.  Normal left ventricular function.  False positive myocardial perfusion study   RECOMMENDATIONS:    No further cardiac evaluation for ischemia and symptoms are not related to myocardial ischemia.       ASSESSMENT:    1. Abnormal nuclear stress test   2. Type 2 diabetes mellitus with complication, without long-term current use of insulin ([place])   3. Snoring   4. Morbid obesity ([place])   5. Hyperlipidemia, unspecified hyperlipidemia type      PLAN:  In order of problems listed above: Abnormal nuclear stress test false positive with normal cardiac catheterization 10/2016. Patient asked if she should take baby aspirin with her diabetes, family history of CAD and hyperlipidemia. I think this is reasonable.  Type 2 diabetes mellitus followed by primary care  Snoring with normal sleep apnea test, not formally reviewed by Dr. Radford Pax yet.  Morbid obesity weight loss desirable  Hyperlipidemia on  Zocor     Medication Adjustments/Labs and Tests Ordered: Current medicines are reviewed at length with the patient today.  Concerns regarding medicines are outlined above.  Medication changes, Labs and Tests ordered today are listed in the Patient Instructions below. Patient Instructions  Medication Instructions:  Your physician recommends that you continue on your current medications as directed. Please refer to the Current Medication list given to you today.   Labwork: NONE ORDERED TODAY   Testing/Procedures: NONE ORDERED TODAY  Follow-Up: Your physician wants you to follow-up in: 6-9 MONTHS WITH DR. Mallie Snooks will receive a reminder letter in the mail two months in advance. If you don't receive a letter, please call our office to schedule the follow-up appointment.   Any Other Special Instructions Will Be Listed Below (If Applicable).     If you need a refill on your cardiac medications before your next appointment, please call your pharmacy.      Signed, Ermalinda Barrios, PA-C  11/22/2016 1:33 PM    [facility] [address], [place], [place]  [address] Phone: [phone]; Fax: [phone]

## 2016-11-22 NOTE — Patient Instructions (Signed)
Medication Instructions:  Your physician recommends that you continue on your current medications as directed. Please refer to the Current Medication list given to you today.   Labwork: NONE ORDERED TODAY   Testing/Procedures: NONE ORDERED TODAY  Follow-Up: Your physician wants you to follow-up in: 6-9 MONTHS WITH DR. Mallie Snooks will receive a reminder letter in the mail two months in advance. If you don't receive a letter, please call our office to schedule the follow-up appointment.   Any Other Special Instructions Will Be Listed Below (If Applicable).     If you need a refill on your cardiac medications before your next appointment, please call your pharmacy.

## 2016-11-23 ENCOUNTER — Telehealth: Payer: Self-pay | Admitting: *Deleted

## 2016-11-23 DIAGNOSIS — R0902 Hypoxemia: Secondary | ICD-10-CM

## 2016-11-23 NOTE — Telephone Encounter (Signed)
-----   Message from Theodoro Parma, RN sent at 11/22/2016  1:34 PM EDT ----- Dr. Radford Pax, this patient saw Ermalinda Barrios today. It doesn't look like her home sleep study results were sent to [facility] or that the patient was called. Please take a look. Selinda Eon told the patient the results and told her if there are any new orders, she'd be called.  Thank you!

## 2016-11-23 NOTE — Telephone Encounter (Signed)
    Informed patient of sleep study results and patient understanding was verbalized. Patient understands she has no evidence of sleep apnea but her 02 sats did drop some. Patient understands Dr Radford Pax recommends an overnight pulse oximetry. Patient was grateful for the call and thanked me.  Sleep study, last office note, and pulse oximetry order faxed to [facility]

## 2016-11-23 NOTE — Telephone Encounter (Signed)
-----   Message from Sueanne Margarita, MD sent at 11/22/2016  5:49 PM EDT ----- No evidence of OSA on home sleep study   Informed patient of sleep study results and patient understanding was verbalized. Patient understands she has no evidence of sleep apnea but her 02 sats did drop some. Patient understands Dr Radford Pax recommends an overnight pulse oximetry. Patient was grateful for the call and thanked me.  Sleep study, last office note, and pulse oximetry order faxed to [facility]

## 2016-11-23 NOTE — Telephone Encounter (Signed)
-----   Message from Sueanne Margarita, MD sent at 11/22/2016  5:49 PM EDT ----- No evidence of OSA on home sleep study

## 2016-11-23 NOTE — Telephone Encounter (Signed)
Informed patient of sleep study results and patient understanding was verbalized. Patient understands she has no evidence of sleep apnea but her 02 sats did drop some. Patient understands Dr Radford Pax recommends an overnight pulse oximetry. Patient was grateful for the call and thanked me.  Sleep study, last office note, and pulse oximetry order faxed to [facility]

## 2016-11-24 DIAGNOSIS — H401113 Primary open-angle glaucoma, right eye, severe stage: Secondary | ICD-10-CM | POA: Diagnosis not present

## 2016-11-24 DIAGNOSIS — H401223 Low-tension glaucoma, left eye, severe stage: Secondary | ICD-10-CM | POA: Diagnosis not present

## 2016-11-24 DIAGNOSIS — H25013 Cortical age-related cataract, bilateral: Secondary | ICD-10-CM | POA: Diagnosis not present

## 2016-11-24 DIAGNOSIS — H3509 Other intraretinal microvascular abnormalities: Secondary | ICD-10-CM | POA: Diagnosis not present

## 2016-11-24 NOTE — Telephone Encounter (Signed)
Confirmation received from [facility] Kennyth Lose)

## 2016-11-26 ENCOUNTER — Encounter: Payer: Self-pay | Admitting: Cardiology

## 2016-11-26 DIAGNOSIS — R9431 Abnormal electrocardiogram [ECG] [EKG]: Secondary | ICD-10-CM | POA: Diagnosis not present

## 2016-11-27 DIAGNOSIS — R9431 Abnormal electrocardiogram [ECG] [EKG]: Secondary | ICD-10-CM | POA: Diagnosis not present

## 2016-11-30 NOTE — Telephone Encounter (Signed)
ONO received and sent to scan

## 2016-12-02 ENCOUNTER — Telehealth: Payer: Self-pay | Admitting: *Deleted

## 2016-12-02 DIAGNOSIS — R0902 Hypoxemia: Secondary | ICD-10-CM

## 2016-12-02 NOTE — Addendum Note (Signed)
Addended by: Freada Bergeron on: 12/02/2016 06:52 PM   Modules accepted: Orders

## 2016-12-02 NOTE — Telephone Encounter (Signed)
Informed patient of sleep study results and patient understanding was verbalized. Patient understands she has significant night time oxygen drops on pulse ox. Patient understands a split night sleep study has been recommended by Dr Radford Pax. IPatient agrees with treatment and thanked me for the call.

## 2016-12-02 NOTE — Telephone Encounter (Signed)
-----   Message from Sueanne Margarita, MD sent at 12/01/2016  8:39 PM EDT ----- Patient had no evidence of OSA on home sleep study but has signifiant nocturnal hypoxemia on overnight pulse ox.  Please set up split night sleep study in lab

## 2016-12-17 NOTE — Telephone Encounter (Signed)
Informed patient of sleep study results and patient understanding was verbalized. Patient understands she has significant night time oxygen drops on pulse ox. Patient understands a split night sleep study has been recommended by Dr Radford Pax. IPatient agrees with treatment and thanked me for the call.

## 2016-12-21 NOTE — Telephone Encounter (Signed)
Patient has a split night sleep study scheduled for 02/11/17.

## 2016-12-30 DIAGNOSIS — M25532 Pain in left wrist: Secondary | ICD-10-CM | POA: Diagnosis not present

## 2017-01-05 NOTE — Telephone Encounter (Signed)
Informed patient of upcoming sleep study and patient understanding was verbalized. Patient understands her sleep study is scheduled for Friday February 11 2017. Patient understands her sleep study will be done at [facility] sleep lab. Patient understands she will receive a sleep packet in a week or so. Patient understands to call if she does not receive the sleep packet in a timely manner. Patient agrees with treatment and thanked me for call.

## 2017-02-10 ENCOUNTER — Ambulatory Visit (INDEPENDENT_AMBULATORY_CARE_PROVIDER_SITE_OTHER): Payer: Medicare Other | Admitting: Orthopaedic Surgery

## 2017-02-10 DIAGNOSIS — M1711 Unilateral primary osteoarthritis, right knee: Secondary | ICD-10-CM

## 2017-02-10 MED ORDER — LIDOCAINE HCL 1 % IJ SOLN
3.0000 mL | INTRAMUSCULAR | Status: AC | PRN
  Administered 2017-02-10: 3 mL

## 2017-02-10 MED ORDER — SODIUM HYALURONATE (VISCOSUP) 20 MG/2ML IX SOSY
20.0000 mg | PREFILLED_SYRINGE | INTRA_ARTICULAR | Status: AC | PRN
  Administered 2017-02-10: 20 mg via INTRA_ARTICULAR

## 2017-02-10 NOTE — Progress Notes (Signed)
Office Visit Note   Patient: [name]           Date of Birth: [birth year]           MRN: [id number] Visit Date: 02/10/2017              Requested by: Aretta Nip, [place], [place] [address] PCP: Aretta Nip, MD   Assessment & Plan: Visit Diagnoses:  1. Unilateral primary osteoarthritis, right knee     Plan: Initiate Euflexxa injections  Follow-Up Instructions: Return in about 1 week (around 02/17/2017).   Orders:  No orders of the defined types were placed in this encounter.  No orders of the defined types were placed in this encounter.     Procedures: Large Joint Inj Date/Time: 02/10/2017 3:28 PM Performed by: Garald Balding Authorized by: Garald Balding   Consent Given by:  Patient Timeout: prior to procedure the correct patient, procedure, and site was verified   Indications:  Pain and joint swelling Location:  Knee Site:  R knee Prep: patient was prepped and draped in usual sterile fashion   Needle Size:  25 G Needle Length:  1.5 inches Approach:  Anteromedial Ultrasound Guidance: No   Fluoroscopic Guidance: No   Arthrogram: No   Medications:  3 mL lidocaine 1 %; 20 mg Sodium Hyaluronate 20 MG/2ML Aspiration Attempted: No   Patient tolerance:  Patient tolerated the procedure well with no immediate complications     Clinical Data: No additional findings.   Subjective: No chief complaint on file. Established diagnosis of tricompartmental degenerative arthrosis right knee with prior response to cortisone. We'll start Visco supplementation today  HPI  Review of Systems   Objective: Vital Signs: There were no vitals taken for this visit.  Physical Exam  Ortho Exam awake alert heart attack history comfortable sitting right knee not hot red or warm. Range of motion approximately 0-100 without instability  Specialty Comments:  No specialty comments available.  Imaging: No results  found.   PMFS History: Patient Active Problem List   Diagnosis Date Noted  . Unilateral primary osteoarthritis, right knee 02/10/2017  . Abnormal nuclear stress test 10/28/2016  . Hyperlipidemia 10/28/2016  . DM (diabetes mellitus) ([place]) 10/28/2016  . Abnormal EKG 10/06/2016  . Morbid obesity ([place]) 10/06/2016  . Family history of early CAD 10/06/2016  . Snoring 10/06/2016   Past Medical History:  Diagnosis Date  . Abnormal EKG 10/06/2016  . Abnormal nuclear stress test 10/28/2016  . Asthma   . Atypical chest pain   . Diabetes mellitus without complication ([place])   . DJD (degenerative joint disease)   . DM (diabetes mellitus) ([place]) 10/28/2016  . Family history of early CAD 10/06/2016  . GERD (gastroesophageal reflux disease)   . H/O squamous cell carcinoma    NOSE DR. [name]  . Hyperlipidemia   . Hypothyroidism   . Morbid obesity ([place])   . Osteopenia   . Severe stage glaucoma    DR. [name] S/P LASER Rx  . Snoring 10/06/2016  . Thyroid disease   . Thyroid nodule    BENIGN    Family History  Problem Relation Age of Onset  . Heart disease Father     Past Surgical History:  Procedure Laterality Date  . BREAST REDUCTION SURGERY    . CHOLECYSTECTOMY    . KNEE ARTHROSCOPY Left   . LEFT HEART CATH AND CORONARY ANGIOGRAPHY N/A 11/05/2016   Procedure: Left Heart Cath  and Coronary Angiography;  Surgeon: Belva Crome, MD;  Location: [facility];  Service: Cardiovascular;  Laterality: N/A;  . S/P COLONOSCOPY  06/2011   HYPERPLASTIC POLYPS REPEAT 06/2016 DR. [name]  . THYROID NODULE REMOVAL    . TONSILLECTOMY    . TUBAL LIGATION    . WRIST SURGERY Left    JOINT REPAIR   Social History   Occupational History  . [employer]    Social History Main Topics  . Smoking status: Never Smoker  . Smokeless tobacco: Never Used  . Alcohol use Yes  . Drug use: No  . Sexual activity: Not on file     Garald Balding, MD   Note - This record has been created  using [employer].  Chart creation errors have been sought, but may not always  have been located. Such creation errors do not reflect on  the standard of medical care.

## 2017-02-11 ENCOUNTER — Ambulatory Visit (HOSPITAL_BASED_OUTPATIENT_CLINIC_OR_DEPARTMENT_OTHER): Payer: Medicare Other | Attending: Cardiology | Admitting: Cardiology

## 2017-02-11 VITALS — Ht 64.0 in | Wt 255.0 lb

## 2017-02-11 DIAGNOSIS — R0602 Shortness of breath: Secondary | ICD-10-CM | POA: Diagnosis not present

## 2017-02-11 DIAGNOSIS — G4734 Idiopathic sleep related nonobstructive alveolar hypoventilation: Secondary | ICD-10-CM

## 2017-02-11 DIAGNOSIS — R0902 Hypoxemia: Secondary | ICD-10-CM

## 2017-02-11 DIAGNOSIS — G4733 Obstructive sleep apnea (adult) (pediatric): Secondary | ICD-10-CM | POA: Diagnosis not present

## 2017-02-17 ENCOUNTER — Ambulatory Visit (INDEPENDENT_AMBULATORY_CARE_PROVIDER_SITE_OTHER): Payer: Medicare Other | Admitting: Orthopaedic Surgery

## 2017-02-17 DIAGNOSIS — M1711 Unilateral primary osteoarthritis, right knee: Secondary | ICD-10-CM

## 2017-02-17 MED ORDER — SODIUM HYALURONATE (VISCOSUP) 20 MG/2ML IX SOSY
20.0000 mg | PREFILLED_SYRINGE | INTRA_ARTICULAR | Status: AC | PRN
  Administered 2017-02-17: 20 mg via INTRA_ARTICULAR

## 2017-02-17 MED ORDER — LIDOCAINE HCL 1 % IJ SOLN
3.0000 mL | INTRAMUSCULAR | Status: AC | PRN
  Administered 2017-02-17: 3 mL

## 2017-02-17 NOTE — Progress Notes (Signed)
Office Visit Note   Patient: [name]           Date of Birth: Sep 20, 1950           MRN: [id number] Visit Date: 02/17/2017              Requested by: Aretta Nip, [place], [place] [address] PCP: Aretta Nip, MD   Assessment & Plan: Visit Diagnoses:  1. Unilateral primary osteoarthritis, right knee     Plan: The first Euflexxa injection made a difference in right knee pain. We will proceed with second injection. Follow-Up Instructions: Return in about 1 week (around 02/24/2017).   Orders:  No orders of the defined types were placed in this encounter.  No orders of the defined types were placed in this encounter.     Procedures: Large Joint Inj Date/Time: 02/17/2017 10:52 AM Performed by: Garald Balding Authorized by: Garald Balding   Consent Given by:  Patient Timeout: prior to procedure the correct patient, procedure, and site was verified   Indications:  Pain and joint swelling Location:  Knee Site:  R knee Prep: patient was prepped and draped in usual sterile fashion   Needle Size:  25 G Needle Length:  1.5 inches Approach:  Anteromedial Ultrasound Guidance: No   Fluoroscopic Guidance: No   Arthrogram: No   Medications:  3 mL lidocaine 1 %; 20 mg Sodium Hyaluronate 20 MG/2ML Aspiration Attempted: No   Patient tolerance:  Patient tolerated the procedure well with no immediate complications     Clinical Data: No additional findings.   Subjective: No chief complaint on file. No problems with right knee after first   Euflexxa injection  HPI  Review of Systems   Objective: Vital Signs: There were no vitals taken for this visit.  Physical Exam  Ortho Exam right knee not hot warm red or swollen.  Specialty Comments:  No specialty comments available.  Imaging: No results found.   PMFS History: Patient Active Problem List   Diagnosis Date Noted  . Unilateral primary osteoarthritis, right  knee 02/10/2017  . Abnormal nuclear stress test 10/28/2016  . Hyperlipidemia 10/28/2016  . DM (diabetes mellitus) ([place]) 10/28/2016  . Abnormal EKG 10/06/2016  . Morbid obesity ([place]) 10/06/2016  . Family history of early CAD 10/06/2016  . Snoring 10/06/2016   Past Medical History:  Diagnosis Date  . Abnormal EKG 10/06/2016  . Abnormal nuclear stress test 10/28/2016  . Asthma   . Atypical chest pain   . Diabetes mellitus without complication ([place])   . DJD (degenerative joint disease)   . DM (diabetes mellitus) ([place]) 10/28/2016  . Family history of early CAD 10/06/2016  . GERD (gastroesophageal reflux disease)   . H/O squamous cell carcinoma    NOSE DR. [name]  . Hyperlipidemia   . Hypothyroidism   . Morbid obesity ([place])   . Osteopenia   . Severe stage glaucoma    DR. [name] S/P LASER Rx  . Snoring 10/06/2016  . Thyroid disease   . Thyroid nodule    BENIGN    Family History  Problem Relation Age of Onset  . Heart disease Father     Past Surgical History:  Procedure Laterality Date  . BREAST REDUCTION SURGERY    . CHOLECYSTECTOMY    . KNEE ARTHROSCOPY Left   . LEFT HEART CATH AND CORONARY ANGIOGRAPHY N/A 11/05/2016   Procedure: Left Heart Cath and Coronary Angiography;  Surgeon: Daneen Schick  W, MD;  Location: [facility];  Service: Cardiovascular;  Laterality: N/A;  . S/P COLONOSCOPY  06/2011   HYPERPLASTIC POLYPS REPEAT 06/2016 DR. [name]  . THYROID NODULE REMOVAL    . TONSILLECTOMY    . TUBAL LIGATION    . WRIST SURGERY Left    JOINT REPAIR   Social History   Occupational History  . [employer]    Social History Main Topics  . Smoking status: Never Smoker  . Smokeless tobacco: Never Used  . Alcohol use Yes  . Drug use: No  . Sexual activity: Not on file     Garald Balding, MD   Note - This record has been created using [employer].  Chart creation errors have been sought, but may not always  have been located. Such creation  errors do not reflect on  the standard of medical care.

## 2017-02-22 NOTE — Procedures (Signed)
   Patient Name: [name], [name] Date: 02/11/2017 Gender: Female D.O.B: 1951/04/12 Age (years): 89 Referring Provider: [name] MD, ABSM Height (inches): 64 Interpreting Physician: [name] MD, ABSM Weight (lbs): 255 RPSGT: Jonna Coup BMI: 44 MRN: [id number] Neck Size: 15.50  CLINICAL INFORMATION Sleep Study Type: NPSG  Indication for sleep study: Excessive Daytime Sleepiness, Obesity, Snoring  Epworth Sleepiness Score: 7  SLEEP STUDY TECHNIQUE As per the AASM Manual for the Scoring of Sleep and Associated Events v2.3 (April 2016) with a hypopnea requiring 4% desaturations.  The channels recorded and monitored were frontal, central and occipital EEG, electrooculogram (EOG), submentalis EMG (chin), nasal and oral airflow, thoracic and abdominal wall motion, anterior tibialis EMG, snore microphone, electrocardiogram, and pulse oximetry.  MEDICATIONS Medications self-administered by patient taken the night of the study : N/A  SLEEP ARCHITECTURE The study was initiated at 10:20:17 PM and ended at 4:30:24 AM.  Sleep onset time was 8.5 minutes and the sleep efficiency was 82.0%. The total sleep time was 303.7 minutes.  Stage REM latency was 85.0 minutes.  The patient spent 2.14% of the night in stage N1 sleep, 71.51% in stage N2 sleep, 7.08% in stage N3 and 19.27% in REM.  Alpha intrusion was absent.  Supine sleep was 37.30%.  RESPIRATORY PARAMETERS  The overall apnea/hypopnea index (AHI) was 2.2 per hour. There were 1 total apneas, including 0 obstructive, 1 central and 0 mixed apneas. There were 10 hypopneas and 0 RERAs.  The AHI during Stage REM sleep was 11.3 per hour.  AHI while supine was 0.0 per hour.  The mean oxygen saturation was 90.11%. The minimum SpO2 during sleep was 80.00%.  moderate snoring was noted during this study.  CARDIAC DATA The 2 lead EKG demonstrated sinus rhythm. The mean heart rate was 64.30 beats per minute. Other  EKG findings include: PVCs.  LEG MOVEMENT DATA The total PLMS were 436 with a resulting PLMS index of 86.15. Associated arousal with leg movement index was 6.1 .  IMPRESSIONS - No significant obstructive sleep apnea occurred during this study (AHI = 2.2/h). - No significant central sleep apnea occurred during this study (CAI = 0.2/h). - Moderate oxygen desaturation was noted during this study (Min O2 = 80.00%). - The patient snored with moderate snoring volume. - EKG findings include PVCs. - Severe periodic limb movements of sleep occurred during the study. Associated arousals were significant.  DIAGNOSIS - Nocturnal Hypoxemia (327.26 [G47.36 ICD-10])  RECOMMENDATIONS - Recommed oxygen at 2L [place] during sleep with repeat oxygen saturation on home O2 during sleep to make sure nocturnal hypoxemia is adequately treated. - Avoid alcohol, sedatives and other CNS depressants that may worsen sleep apnea and disrupt normal sleep architecture. - Sleep hygiene should be reviewed to assess factors that may improve sleep quality. - Weight management and regular exercise should be initiated or continued if appropriate.  [place], American Board of Sleep Medicine  ELECTRONICALLY SIGNED ON:  02/22/2017, 10:37 PM [place] PH: [phone]   FX: [phone] [place]

## 2017-02-24 ENCOUNTER — Ambulatory Visit (INDEPENDENT_AMBULATORY_CARE_PROVIDER_SITE_OTHER): Payer: Medicare Other | Admitting: Orthopaedic Surgery

## 2017-02-24 ENCOUNTER — Telehealth: Payer: Self-pay | Admitting: *Deleted

## 2017-02-24 ENCOUNTER — Encounter: Payer: Self-pay | Admitting: Cardiology

## 2017-02-24 ENCOUNTER — Other Ambulatory Visit: Payer: Self-pay | Admitting: Family Medicine

## 2017-02-24 DIAGNOSIS — G4734 Idiopathic sleep related nonobstructive alveolar hypoventilation: Secondary | ICD-10-CM

## 2017-02-24 DIAGNOSIS — Z1231 Encounter for screening mammogram for malignant neoplasm of breast: Secondary | ICD-10-CM

## 2017-02-24 NOTE — Telephone Encounter (Signed)
-----   Message from Sueanne Margarita, MD sent at 02/22/2017 10:41 PM EDT ----- Please let patient know that sleep study showed no significant sleep apnea but she does have nocturnal hypoxemia.  Please order O2 at 2L [place] during sleep nightly and get an overnight pulse ox on 2L to make sure that this is adequately treated.

## 2017-02-24 NOTE — Telephone Encounter (Signed)
Informed patient of sleep study results and patient understanding was verbalized. Patient understands her sleep study showed no sleep apnea but she does have night time oxygen drops. Patient understands that Dr Radford Pax has ordered 02 at 2L [place] during sleep nightly and an overnight pulse ox on 2L to make sure that this is adequately treated. Patient agrees with treatment and thanked me for the call. [place] notified of new order in EPIC

## 2017-02-28 DIAGNOSIS — H401113 Primary open-angle glaucoma, right eye, severe stage: Secondary | ICD-10-CM | POA: Diagnosis not present

## 2017-02-28 DIAGNOSIS — H1851 Endothelial corneal dystrophy: Secondary | ICD-10-CM | POA: Diagnosis not present

## 2017-02-28 DIAGNOSIS — H401223 Low-tension glaucoma, left eye, severe stage: Secondary | ICD-10-CM | POA: Diagnosis not present

## 2017-02-28 DIAGNOSIS — H40053 Ocular hypertension, bilateral: Secondary | ICD-10-CM | POA: Diagnosis not present

## 2017-03-07 DIAGNOSIS — Z7984 Long term (current) use of oral hypoglycemic drugs: Secondary | ICD-10-CM | POA: Diagnosis not present

## 2017-03-07 DIAGNOSIS — E119 Type 2 diabetes mellitus without complications: Secondary | ICD-10-CM | POA: Diagnosis not present

## 2017-03-07 DIAGNOSIS — Z23 Encounter for immunization: Secondary | ICD-10-CM | POA: Diagnosis not present

## 2017-03-07 DIAGNOSIS — E039 Hypothyroidism, unspecified: Secondary | ICD-10-CM | POA: Diagnosis not present

## 2017-03-07 DIAGNOSIS — E7800 Pure hypercholesterolemia, unspecified: Secondary | ICD-10-CM | POA: Diagnosis not present

## 2017-03-08 ENCOUNTER — Encounter: Payer: Self-pay | Admitting: Cardiology

## 2017-03-11 ENCOUNTER — Encounter: Payer: Self-pay | Admitting: Cardiology

## 2017-03-16 ENCOUNTER — Ambulatory Visit (INDEPENDENT_AMBULATORY_CARE_PROVIDER_SITE_OTHER): Payer: Medicare Other | Admitting: Psychology

## 2017-03-16 ENCOUNTER — Encounter: Payer: Self-pay | Admitting: Cardiology

## 2017-03-16 DIAGNOSIS — F4321 Adjustment disorder with depressed mood: Secondary | ICD-10-CM | POA: Diagnosis not present

## 2017-03-17 NOTE — Telephone Encounter (Signed)
Per Medicare guidelines patient needs an office visit to discuss hypoventilation syndrome code G47.34 and G47.36 referenced in your sleep study interpretations which is causing nocturnal hypoxemia.  Patient had sleep study to qualify for cpap. Patient don't qualify for cpap but still exibits hypoventilation.  The patient needs night-time oxygen then will need an ONO order on room air asap to qualify within 30 days.

## 2017-03-20 NOTE — Telephone Encounter (Signed)
I dont understand why she needs another OV to discuss this as she has already had an OV which we discussed this at and that is why we orderd a sleep study to begin with.  The sleep study gave the diagnosis and she shouldn't have to pay for another OV just to discuss the results as she will likely need another OV after starting O2 to see how she is doing

## 2017-03-29 ENCOUNTER — Other Ambulatory Visit (INDEPENDENT_AMBULATORY_CARE_PROVIDER_SITE_OTHER): Payer: Self-pay | Admitting: Orthopaedic Surgery

## 2017-03-29 MED ORDER — PREGABALIN 50 MG PO CAPS: 50.0000 mg | ORAL_CAPSULE | Freq: Two times a day (BID) | ORAL | 3 refills | Status: DC

## 2017-03-29 MED ORDER — DICLOFENAC SODIUM 1 % TD GEL: 2.0000 g | Freq: Four times a day (QID) | TRANSDERMAL | 5 refills | Status: DC

## 2017-04-05 DIAGNOSIS — S73101A Unspecified sprain of right hip, initial encounter: Secondary | ICD-10-CM | POA: Diagnosis not present

## 2017-04-08 ENCOUNTER — Ambulatory Visit
Admission: RE | Admit: 2017-04-08 | Discharge: 2017-04-08 | Disposition: A | Discharge status: Home / Self Care | Payer: Medicare Other | Source: Ambulatory Visit | Attending: Family Medicine | Admitting: Family Medicine

## 2017-04-08 DIAGNOSIS — Z1231 Encounter for screening mammogram for malignant neoplasm of breast: Secondary | ICD-10-CM | POA: Diagnosis not present

## 2017-04-14 ENCOUNTER — Telehealth: Payer: Self-pay | Admitting: *Deleted

## 2017-04-14 DIAGNOSIS — G4733 Obstructive sleep apnea (adult) (pediatric): Secondary | ICD-10-CM

## 2017-04-14 DIAGNOSIS — G4734 Idiopathic sleep related nonobstructive alveolar hypoventilation: Secondary | ICD-10-CM

## 2017-04-14 NOTE — Telephone Encounter (Signed)
Late entry: Spoke to [facility]) and was informed the patient needs an office visit to discuss chronic diagnosis. Then patient needs a new order for the ONO on RA. Based on the ONO results she can then get oxygen.

## 2017-04-14 NOTE — Telephone Encounter (Signed)
Received information today that the patient does not need another office visit until after she starts 02.  Reached out to the patient but [place] and she says she will call our office when she returns in January.

## 2017-05-22 ENCOUNTER — Encounter: Payer: Self-pay | Admitting: Cardiology

## 2017-05-23 ENCOUNTER — Ambulatory Visit (INDEPENDENT_AMBULATORY_CARE_PROVIDER_SITE_OTHER): Payer: Medicare Other | Admitting: Psychology

## 2017-05-23 DIAGNOSIS — F4321 Adjustment disorder with depressed mood: Secondary | ICD-10-CM | POA: Diagnosis not present

## 2017-05-24 NOTE — Telephone Encounter (Signed)
Please order an overnight pulse ox on room air

## 2017-06-02 NOTE — Telephone Encounter (Signed)
Patient needs an office visit before she can have another ONO.

## 2017-06-22 DIAGNOSIS — H5703 Miosis: Secondary | ICD-10-CM | POA: Diagnosis not present

## 2017-06-22 DIAGNOSIS — H47329 Drusen of optic disc, unspecified eye: Secondary | ICD-10-CM | POA: Diagnosis not present

## 2017-06-22 DIAGNOSIS — H401233 Low-tension glaucoma, bilateral, severe stage: Secondary | ICD-10-CM | POA: Diagnosis not present

## 2017-06-22 DIAGNOSIS — H1851 Endothelial corneal dystrophy: Secondary | ICD-10-CM | POA: Diagnosis not present

## 2017-06-28 ENCOUNTER — Ambulatory Visit: Payer: Medicare Other | Admitting: Psychology

## 2017-06-28 NOTE — Telephone Encounter (Signed)
Office visit is scheduled for April 3 at 4pm with Dr Radford Pax.

## 2017-07-12 ENCOUNTER — Ambulatory Visit (INDEPENDENT_AMBULATORY_CARE_PROVIDER_SITE_OTHER): Payer: Medicare Other | Admitting: Psychology

## 2017-07-12 DIAGNOSIS — F4321 Adjustment disorder with depressed mood: Secondary | ICD-10-CM | POA: Diagnosis not present

## 2017-08-04 ENCOUNTER — Ambulatory Visit: Payer: Self-pay | Admitting: Cardiology

## 2017-08-10 ENCOUNTER — Encounter: Payer: Self-pay | Admitting: Cardiology

## 2017-08-10 ENCOUNTER — Ambulatory Visit (INDEPENDENT_AMBULATORY_CARE_PROVIDER_SITE_OTHER): Payer: Medicare Other | Admitting: Cardiology

## 2017-08-10 VITALS — BP 132/72 | HR 72 | Ht 64.0 in | Wt 244.0 lb

## 2017-08-10 DIAGNOSIS — E0800 Diabetes mellitus due to underlying condition with hyperosmolarity without nonketotic hyperglycemic-hyperosmolar coma (NKHHC): Secondary | ICD-10-CM

## 2017-08-10 DIAGNOSIS — R9431 Abnormal electrocardiogram [ECG] [EKG]: Secondary | ICD-10-CM

## 2017-08-10 DIAGNOSIS — G4734 Idiopathic sleep related nonobstructive alveolar hypoventilation: Secondary | ICD-10-CM | POA: Diagnosis not present

## 2017-08-10 DIAGNOSIS — R9439 Abnormal result of other cardiovascular function study: Secondary | ICD-10-CM

## 2017-08-10 HISTORY — DX: Idiopathic sleep related nonobstructive alveolar hypoventilation: G47.34

## 2017-08-10 NOTE — Addendum Note (Signed)
Addended by: Teressa Senter on: 08/10/2017 05:27 PM   Modules accepted: Orders

## 2017-08-10 NOTE — Telephone Encounter (Addendum)
Per Dr Radford Pax order sent to [facility]:  Order 2L oxygen [place] at bedtime then get ONO on 2L

## 2017-08-10 NOTE — Patient Instructions (Signed)
Medication Instructions:  Your physician recommends that you continue on your current medications as directed. Please refer to the Current Medication list given to you today.  Labwork: None ordered   Testing/Procedures: Your physician has requested that you have an echocardiogram. Echocardiography is a painless test that uses sound waves to create images of your heart. It provides your doctor with information about the size and shape of your heart and how well your heart's chambers and valves are working. This procedure takes approximately one hour. There are no restrictions for this procedure.   Follow-Up: Your physician wants you to follow-up in: 1 year with Dr. Turner. You will receive a reminder letter in the mail two months in advance. If you don't receive a letter, please call our office to schedule the follow-up appointment.  Any Other Special Instructions Will Be Listed Below (If Applicable).    Thank you for choosing [facility]    Rena Marquette Blodgett, RN  [phone]  If you need a refill on your cardiac medications before your next appointment, please call your pharmacy.   

## 2017-08-10 NOTE — H&P (View-Only) (Signed)
Cardiology Office Note:    Date:  08/10/2017   ID:  [name], DOB 1951-04-04, MRN [id number]  PCP:  Aretta Nip, MD  Cardiologist:  No primary care provider on file.    Referring MD: Aretta Nip, MD   Chief Complaint  Patient presents with  . Follow-up    nocturnal hypoxemia    History of Present Illness:    [name] is a 67 y.o. female with a hx of hyperlipidemia, type II DM, family history of CAD and morbid obesity.  She initially was referred to me for abnormal EKG showing incomplete right bundle branch block and possible anterior septal infarct.  Repeat EKG at our office showed incomplete right bundle branch block with no Q waves.  Clear stress test showed 1 mm of upsloping ST segment depression with small inferior wall infarct from the apex to the base, no ischemia and EF 65%.  She subsequently underwent cardiac catheterization on 11/05/2016 showing normal coronary arteries and normal LV function.  She also underwent sleep study due to complaints of snoring and excessive daytime sleepiness with an Epworth sleepiness score 7.  She had no evidence of sleep apnea but did have moderate oxygen desaturations with nocturnal hypoxemia with oxygen as low as 80%.  She was started on oxygen 2 L nasal cannula at nighttime.  She is here today for followup and is doing well.  She denies any chest pain or pressure, SOB, DOE, PND, orthopnea, LE edema, dizziness, palpitations or syncope. She is compliant with her meds and is tolerating meds with no SE.      Past Medical History:  Diagnosis Date  . Abnormal EKG 10/06/2016  . Abnormal nuclear stress test 10/28/2016  . Asthma   . Atypical chest pain   . Diabetes mellitus without complication ([place])   . DJD (degenerative joint disease)   . DM (diabetes mellitus) ([place]) 10/28/2016  . Family history of early CAD 10/06/2016  . GERD (gastroesophageal reflux disease)   . H/O squamous cell carcinoma    NOSE DR. [name]  .  Hyperlipidemia   . Hypothyroidism   . Morbid obesity ([place])   . Nocturnal hypoxemia 08/10/2017  . Osteopenia   . Severe stage glaucoma    DR. [name] S/P LASER Rx  . Snoring 10/06/2016  . Thyroid disease   . Thyroid nodule    BENIGN    Past Surgical History:  Procedure Laterality Date  . BREAST REDUCTION SURGERY    . CHOLECYSTECTOMY    . KNEE ARTHROSCOPY Left   . LEFT HEART CATH AND CORONARY ANGIOGRAPHY N/A 11/05/2016   Procedure: Left Heart Cath and Coronary Angiography;  Surgeon: Belva Crome, MD;  Location: [facility];  Service: Cardiovascular;  Laterality: N/A;  . REDUCTION MAMMAPLASTY Bilateral   . S/P COLONOSCOPY  06/2011   HYPERPLASTIC POLYPS REPEAT 06/2016 DR. [name]  . THYROID NODULE REMOVAL    . TONSILLECTOMY    . TUBAL LIGATION    . WRIST SURGERY Left    JOINT REPAIR    Current Medications: Current Meds  Medication Sig  . brimonidine (ALPHAGAN) 0.2 % ophthalmic solution Place 1 drop into both eyes 2 (two) times daily.   . carboxymethylcellulose (REFRESH PLUS) 0.5 % SOLN Place 1 drop into both eyes 3 (three) times daily as needed (for dry eyes).  . cetirizine (ZYRTEC) 10 MG tablet Take 10 mg by mouth at bedtime.  . Cholecalciferol (VITAMIN D-3) 5000 units TABS Take 5,000 Units  by mouth every Monday, Wednesday, and Friday.  . diclofenac sodium (VOLTAREN) 1 % GEL Apply 1-2 g topically 3 (three) times daily as needed (for knee pain.).  [name] empagliflozin (JARDIANCE) 10 MG TABS tablet Take 10 mg by mouth daily.  [name] esomeprazole (NEXIUM) 20 MG capsule Take 20 mg by mouth daily as needed (for acid reflux/indigestion).   [name] glimepiride (AMARYL) 4 MG tablet Take 4 mg by mouth 2 (two) times daily.   [name] ibuprofen (ADVIL,MOTRIN) 200 MG tablet Take 200-400 mg by mouth every 8 (eight) hours as needed. For pain  . latanoprost (XALATAN) 0.005 % ophthalmic solution Place 1 drop into both eyes at bedtime.   [name] levothyroxine (SYNTHROID, LEVOTHROID) 150 MCG tablet Take 150 mcg by mouth  daily before breakfast.   . metFORMIN (GLUCOPHAGE) 500 MG tablet Take 1,000 mg by mouth 2 (two) times daily with a meal.   . methocarbamol (ROBAXIN) 500 MG tablet Take 1 tablet (500 mg total) by mouth 2 (two) times daily. (Patient taking differently: Take 500 mg by mouth 2 (two) times daily as needed for muscle spasms. )  . Multiple Vitamin (MULTIVITAMIN WITH MINERALS) TABS Take 1 tablet by mouth at bedtime. CENTRUM  . Omega-3 Fatty Acids (FISH OIL ULTRA) 1400 MG CAPS Take 1,400 mg by mouth every Tuesday, Thursday, Saturday, and Sunday at 6 PM.  . pregabalin (LYRICA) 50 MG capsule Take 50 mg by mouth every Monday, Wednesday, and Friday. In the morning  . simvastatin (ZOCOR) 40 MG tablet Take 40 mg by mouth every Monday, Wednesday, and Friday at 8 PM.     Allergies:   Ciprofloxacin and Sulfa antibiotics   Social History   Socioeconomic History  . Marital status: Married    Spouse name: Not on file  . Number of children: 3  . Years of education: COLLEGE  . Highest education level: Not on file  Occupational History  . Occupation: [employer]  Social Needs  . Financial resource strain: Not on file  . Food insecurity:    Worry: Not on file    Inability: Not on file  . Transportation needs:    Medical: Not on file    Non-medical: Not on file  Tobacco Use  . Smoking status: Never Smoker  . Smokeless tobacco: Never Used  Substance and Sexual Activity  . Alcohol use: Yes  . Drug use: No  . Sexual activity: Not on file  Lifestyle  . Physical activity:    Days per week: Not on file    Minutes per session: Not on file  . Stress: Not on file  Relationships  . Social connections:    Talks on phone: Not on file    Gets together: Not on file    Attends religious service: Not on file    Active member of club or organization: Not on file    Attends meetings of clubs or organizations: Not on file    Relationship status: Not on file  Other Topics Concern  . Not on file  Social  History Narrative  . Not on file     Family History: The patient's family history includes Heart disease in her father.  ROS:   Please see the history of present illness.    ROS  All other systems reviewed and negative.   EKGs/Labs/Other Studies Reviewed:    The following studies were reviewed today: Sleep study  EKG:  EKG is not ordered today.    Recent Labs: 10/28/2016: BUN 17; Creatinine, Ser  0.57; Hemoglobin 14.4; Platelets 283; Potassium 5.1; Sodium 142   Recent Lipid Panel No results found for: CHOL, TRIG, HDL, CHOLHDL, VLDL, LDLCALC, LDLDIRECT  Physical Exam:    VS:  BP 132/72   Pulse 72   Ht 5\' 4"  (1.626 m)   Wt 244 lb (110.7 kg)   BMI 41.88 kg/m     Wt Readings from Last 3 Encounters:  08/10/17 244 lb (110.7 kg)  02/11/17 255 lb (115.7 kg)  11/22/16 255 lb 6.4 oz (115.8 kg)     GEN:  Well nourished, well developed in no acute distress HEENT: Normal NECK: No JVD; No carotid bruits LYMPHATICS: No lymphadenopathy CARDIAC: RRR, no murmurs, rubs, gallops RESPIRATORY:  Clear to auscultation without rales, wheezing or rhonchi  ABDOMEN: Soft, non-tender, non-distended MUSCULOSKELETAL:  No edema; No deformity  SKIN: Warm and dry NEUROLOGIC:  Alert and oriented x 3 PSYCHIATRIC:  Normal affect   ASSESSMENT:    1. Nocturnal hypoxemia   2. Diabetes mellitus due to underlying condition with hyperosmolarity without coma, without long-term current use of insulin ([place])   3. Abnormal EKG    PLAN:    In order of problems listed above:  1.  Nocturnal hypoxemia - She has not started on oxygen therapy yet since she was gone to [place] for over a month.   He has home care required a face-to-face office visit prior to ordering the oxygen.  We will now start her on oxygen at 2 L nasal cannula at night.  I will to get an overnight pulse oximetry on 2 L to make sure that she is not having continued hypoxia.  2.  DM - followed by her PCP.  3.  Abnormal EKG with IRBBB.   Cardiac cath for abnormal ETT showed normal coronary arteries and normal LVF.     Medication Adjustments/Labs and Tests Ordered: Current medicines are reviewed at length with the patient today.  Concerns regarding medicines are outlined above.  No orders of the defined types were placed in this encounter.  No orders of the defined types were placed in this encounter.   Signed, [name], MD  08/10/2017 4:42 PM    [place]

## 2017-08-10 NOTE — Progress Notes (Signed)
Cardiology Office Note:    Date:  08/10/2017   ID:  [name], DOB [birth year], MRN [id number]  PCP:  Aretta Nip, MD  Cardiologist:  No primary care provider on file.    Referring MD: Aretta Nip, MD   Chief Complaint  Patient presents with  . Follow-up    nocturnal hypoxemia    History of Present Illness:    [name] is a 67 y.o. female with a hx of hyperlipidemia, type II DM, family history of CAD and morbid obesity.  She initially was referred to me for abnormal EKG showing incomplete right bundle branch block and possible anterior septal infarct.  Repeat EKG at our office showed incomplete right bundle branch block with no Q waves.  Clear stress test showed 1 mm of upsloping ST segment depression with small inferior wall infarct from the apex to the base, no ischemia and EF 65%.  She subsequently underwent cardiac catheterization on 11/05/2016 showing normal coronary arteries and normal LV function.  She also underwent sleep study due to complaints of snoring and excessive daytime sleepiness with an Epworth sleepiness score 7.  She had no evidence of sleep apnea but did have moderate oxygen desaturations with nocturnal hypoxemia with oxygen as low as 80%.  She was started on oxygen 2 L nasal cannula at nighttime.  She is here today for followup and is doing well.  She denies any chest pain or pressure, SOB, DOE, PND, orthopnea, LE edema, dizziness, palpitations or syncope. She is compliant with her meds and is tolerating meds with no SE.      Past Medical History:  Diagnosis Date  . Abnormal EKG 10/06/2016  . Abnormal nuclear stress test 10/28/2016  . Asthma   . Atypical chest pain   . Diabetes mellitus without complication ([place])   . DJD (degenerative joint disease)   . DM (diabetes mellitus) ([place]) 10/28/2016  . Family history of early CAD 10/06/2016  . GERD (gastroesophageal reflux disease)   . H/O squamous cell carcinoma    NOSE DR. [name]  .  Hyperlipidemia   . Hypothyroidism   . Morbid obesity ([place])   . Nocturnal hypoxemia 08/10/2017  . Osteopenia   . Severe stage glaucoma    DR. [name] S/P LASER Rx  . Snoring 10/06/2016  . Thyroid disease   . Thyroid nodule    BENIGN    Past Surgical History:  Procedure Laterality Date  . BREAST REDUCTION SURGERY    . CHOLECYSTECTOMY    . KNEE ARTHROSCOPY Left   . LEFT HEART CATH AND CORONARY ANGIOGRAPHY N/A 11/05/2016   Procedure: Left Heart Cath and Coronary Angiography;  Surgeon: Belva Crome, MD;  Location: [facility];  Service: Cardiovascular;  Laterality: N/A;  . REDUCTION MAMMAPLASTY Bilateral   . S/P COLONOSCOPY  06/2011   HYPERPLASTIC POLYPS REPEAT 06/2016 DR. [name]  . THYROID NODULE REMOVAL    . TONSILLECTOMY    . TUBAL LIGATION    . WRIST SURGERY Left    JOINT REPAIR    Current Medications: Current Meds  Medication Sig  . brimonidine (ALPHAGAN) 0.2 % ophthalmic solution Place 1 drop into both eyes 2 (two) times daily.   . carboxymethylcellulose (REFRESH PLUS) 0.5 % SOLN Place 1 drop into both eyes 3 (three) times daily as needed (for dry eyes).  . cetirizine (ZYRTEC) 10 MG tablet Take 10 mg by mouth at bedtime.  . Cholecalciferol (VITAMIN D-3) 5000 units TABS Take 5,000 Units  by mouth every Monday, Wednesday, and Friday.  . diclofenac sodium (VOLTAREN) 1 % GEL Apply 1-2 g topically 3 (three) times daily as needed (for knee pain.).  [name] empagliflozin (JARDIANCE) 10 MG TABS tablet Take 10 mg by mouth daily.  [name] esomeprazole (NEXIUM) 20 MG capsule Take 20 mg by mouth daily as needed (for acid reflux/indigestion).   [name] glimepiride (AMARYL) 4 MG tablet Take 4 mg by mouth 2 (two) times daily.   [name] ibuprofen (ADVIL,MOTRIN) 200 MG tablet Take 200-400 mg by mouth every 8 (eight) hours as needed. For pain  . latanoprost (XALATAN) 0.005 % ophthalmic solution Place 1 drop into both eyes at bedtime.   [name] levothyroxine (SYNTHROID, LEVOTHROID) 150 MCG tablet Take 150 mcg by mouth  daily before breakfast.   . metFORMIN (GLUCOPHAGE) 500 MG tablet Take 1,000 mg by mouth 2 (two) times daily with a meal.   . methocarbamol (ROBAXIN) 500 MG tablet Take 1 tablet (500 mg total) by mouth 2 (two) times daily. (Patient taking differently: Take 500 mg by mouth 2 (two) times daily as needed for muscle spasms. )  . Multiple Vitamin (MULTIVITAMIN WITH MINERALS) TABS Take 1 tablet by mouth at bedtime. CENTRUM  . Omega-3 Fatty Acids (FISH OIL ULTRA) 1400 MG CAPS Take 1,400 mg by mouth every Tuesday, Thursday, Saturday, and Sunday at 6 PM.  . pregabalin (LYRICA) 50 MG capsule Take 50 mg by mouth every Monday, Wednesday, and Friday. In the morning  . simvastatin (ZOCOR) 40 MG tablet Take 40 mg by mouth every Monday, Wednesday, and Friday at 8 PM.     Allergies:   Ciprofloxacin and Sulfa antibiotics   Social History   Socioeconomic History  . Marital status: Married    Spouse name: Not on file  . Number of children: 3  . Years of education: COLLEGE  . Highest education level: Not on file  Occupational History  . Occupation: [employer]  Social Needs  . Financial resource strain: Not on file  . Food insecurity:    Worry: Not on file    Inability: Not on file  . Transportation needs:    Medical: Not on file    Non-medical: Not on file  Tobacco Use  . Smoking status: Never Smoker  . Smokeless tobacco: Never Used  Substance and Sexual Activity  . Alcohol use: Yes  . Drug use: No  . Sexual activity: Not on file  Lifestyle  . Physical activity:    Days per week: Not on file    Minutes per session: Not on file  . Stress: Not on file  Relationships  . Social connections:    Talks on phone: Not on file    Gets together: Not on file    Attends religious service: Not on file    Active member of club or organization: Not on file    Attends meetings of clubs or organizations: Not on file    Relationship status: Not on file  Other Topics Concern  . Not on file  Social  History Narrative  . Not on file     Family History: The patient's family history includes Heart disease in her father.  ROS:   Please see the history of present illness.    ROS  All other systems reviewed and negative.   EKGs/Labs/Other Studies Reviewed:    The following studies were reviewed today: Sleep study  EKG:  EKG is not ordered today.    Recent Labs: 10/28/2016: BUN 17; Creatinine, Ser  0.57; Hemoglobin 14.4; Platelets 283; Potassium 5.1; Sodium 142   Recent Lipid Panel No results found for: CHOL, TRIG, HDL, CHOLHDL, VLDL, LDLCALC, LDLDIRECT  Physical Exam:    VS:  BP 132/72   Pulse 72   Ht 5\' 4"  (1.626 m)   Wt 244 lb (110.7 kg)   BMI 41.88 kg/m     Wt Readings from Last 3 Encounters:  08/10/17 244 lb (110.7 kg)  02/11/17 255 lb (115.7 kg)  11/22/16 255 lb 6.4 oz (115.8 kg)     GEN:  Well nourished, well developed in no acute distress HEENT: Normal NECK: No JVD; No carotid bruits LYMPHATICS: No lymphadenopathy CARDIAC: RRR, no murmurs, rubs, gallops RESPIRATORY:  Clear to auscultation without rales, wheezing or rhonchi  ABDOMEN: Soft, non-tender, non-distended MUSCULOSKELETAL:  No edema; No deformity  SKIN: Warm and dry NEUROLOGIC:  Alert and oriented x 3 PSYCHIATRIC:  Normal affect   ASSESSMENT:    1. Nocturnal hypoxemia   2. Diabetes mellitus due to underlying condition with hyperosmolarity without coma, without long-term current use of insulin ([place])   3. Abnormal EKG    PLAN:    In order of problems listed above:  1.  Nocturnal hypoxemia - She has not started on oxygen therapy yet since she was gone to [place] for over a month.   He has home care required a face-to-face office visit prior to ordering the oxygen.  We will now start her on oxygen at 2 L nasal cannula at night.  I will to get an overnight pulse oximetry on 2 L to make sure that she is not having continued hypoxia.  2.  DM - followed by her PCP.  3.  Abnormal EKG with IRBBB.   Cardiac cath for abnormal ETT showed normal coronary arteries and normal LVF.     Medication Adjustments/Labs and Tests Ordered: Current medicines are reviewed at length with the patient today.  Concerns regarding medicines are outlined above.  No orders of the defined types were placed in this encounter.  No orders of the defined types were placed in this encounter.   Signed, [name], MD  08/10/2017 4:42 PM    [place]

## 2017-08-10 NOTE — Addendum Note (Signed)
Addended by: Teressa Senter on: 08/10/2017 05:18 PM   Modules accepted: Orders

## 2017-08-10 NOTE — Telephone Encounter (Addendum)
Patient in office today to see Dr Radford Pax. Per Dr Radford Pax:  Order 2L oxygen [place] at bedtime then get ONO on 2L

## 2017-08-10 NOTE — Addendum Note (Signed)
Addended by: Freada Bergeron on: 08/10/2017 05:30 PM   Modules accepted: Orders

## 2017-08-11 ENCOUNTER — Encounter: Payer: Self-pay | Admitting: *Deleted

## 2017-08-11 NOTE — Telephone Encounter (Signed)
Per Corene Cornea at [facility] Patient needs a chronic diagnosis like obesity hyperventilation syndrome, What's causing the nocturnal hypoxemia, and another ONO on room air to be approved for home oxygen. You can contact [place] at [phone] if you have questions.

## 2017-08-11 NOTE — Telephone Encounter (Signed)
This encounter was created in error - please disregard.

## 2017-08-15 NOTE — Telephone Encounter (Addendum)
Per Dr Radford Pax... order an overnight Oxygen sat on RA with dx obesity hypoventiation syndrome ordered today and faxed to [facility]

## 2017-08-15 NOTE — Addendum Note (Signed)
Addended by: Freada Bergeron on: 08/15/2017 05:52 PM   Modules accepted: Orders

## 2017-08-17 ENCOUNTER — Ambulatory Visit (HOSPITAL_COMMUNITY): Payer: Medicare Other | Attending: Internal Medicine

## 2017-08-17 ENCOUNTER — Other Ambulatory Visit: Payer: Self-pay

## 2017-08-17 DIAGNOSIS — G4734 Idiopathic sleep related nonobstructive alveolar hypoventilation: Secondary | ICD-10-CM | POA: Diagnosis not present

## 2017-08-17 DIAGNOSIS — R9439 Abnormal result of other cardiovascular function study: Secondary | ICD-10-CM | POA: Diagnosis not present

## 2017-08-17 DIAGNOSIS — E785 Hyperlipidemia, unspecified: Secondary | ICD-10-CM | POA: Diagnosis not present

## 2017-08-17 DIAGNOSIS — I10 Essential (primary) hypertension: Secondary | ICD-10-CM | POA: Insufficient documentation

## 2017-08-17 DIAGNOSIS — Z8249 Family history of ischemic heart disease and other diseases of the circulatory system: Secondary | ICD-10-CM | POA: Insufficient documentation

## 2017-08-17 DIAGNOSIS — R9431 Abnormal electrocardiogram [ECG] [EKG]: Secondary | ICD-10-CM | POA: Diagnosis not present

## 2017-08-17 MED ORDER — PERFLUTREN LIPID MICROSPHERE
1.0000 mL | INTRAVENOUS | Status: AC | PRN
  Administered 2017-08-17: 2 mL via INTRAVENOUS

## 2017-08-18 ENCOUNTER — Telehealth: Payer: Self-pay

## 2017-08-18 DIAGNOSIS — Z79899 Other long term (current) drug therapy: Secondary | ICD-10-CM

## 2017-08-18 NOTE — Telephone Encounter (Signed)
Notes recorded by Teressa Senter, RN on 08/18/2017 at 3:39 PM EDT Patient made aware of echo results and Dr. Theodosia Blender recommendation for TEE to rule out PFO. Patient in agreement with plan. TEE instructions reviewed with patient and sent to MyChart  Patient scheduled for TEE on 08/23/17 at 10AM with Dr. Marlou Porch. Patient scheduled for BMET and CBC. Case no. [id number].  Notes recorded by Sueanne Margarita, MD on 08/17/2017 at 4:29 PM EDT Echo showed normal LVF with moderate LVH and no ASD but could not exclude PFO. Please set up for TEE with bubble study to rule out PFO given patient's nocturnal hypoxemia

## 2017-08-18 NOTE — Progress Notes (Signed)
Pt aware that TEE instructions are at front desk ready for pick up

## 2017-08-19 ENCOUNTER — Ambulatory Visit (INDEPENDENT_AMBULATORY_CARE_PROVIDER_SITE_OTHER): Payer: Medicare Other | Admitting: Psychology

## 2017-08-19 ENCOUNTER — Other Ambulatory Visit: Payer: Medicare Other

## 2017-08-19 ENCOUNTER — Encounter: Payer: Self-pay | Admitting: Cardiology

## 2017-08-19 DIAGNOSIS — F4321 Adjustment disorder with depressed mood: Secondary | ICD-10-CM | POA: Diagnosis not present

## 2017-08-19 DIAGNOSIS — Z79899 Other long term (current) drug therapy: Secondary | ICD-10-CM | POA: Diagnosis not present

## 2017-08-19 DIAGNOSIS — R0902 Hypoxemia: Secondary | ICD-10-CM | POA: Diagnosis not present

## 2017-08-19 DIAGNOSIS — J449 Chronic obstructive pulmonary disease, unspecified: Secondary | ICD-10-CM | POA: Diagnosis not present

## 2017-08-19 LAB — CBC
HEMOGLOBIN: 14.2 g/dL (ref 11.1–15.9)
Hematocrit: 43.8 % (ref 34.0–46.6)
MCH: 28.5 pg (ref 26.6–33.0)
MCHC: 32.4 g/dL (ref 31.5–35.7)
MCV: 88 fL (ref 79–97)
PLATELETS: 269 10*3/uL (ref 150–379)
RBC: 4.98 x10E6/uL (ref 3.77–5.28)
RDW: 14.2 % (ref 12.3–15.4)
WBC: 8.1 10*3/uL (ref 3.4–10.8)

## 2017-08-19 LAB — BASIC METABOLIC PANEL
BUN/Creatinine Ratio: 26 (ref 12–28)
BUN: 16 mg/dL (ref 8–27)
CO2: 24 mmol/L (ref 20–29)
CREATININE: 0.62 mg/dL (ref 0.57–1.00)
Calcium: 10.6 mg/dL — ABNORMAL HIGH (ref 8.7–10.3)
Chloride: 100 mmol/L (ref 96–106)
GFR calc Af Amer: 108 mL/min/{1.73_m2} (ref >59)
GFR, EST NON AFRICAN AMERICAN: 94 mL/min/{1.73_m2} (ref >59)
Glucose: 123 mg/dL — ABNORMAL HIGH (ref 65–99)
Potassium: 5.1 mmol/L (ref 3.5–5.2)
SODIUM: 140 mmol/L (ref 134–144)

## 2017-08-23 ENCOUNTER — Encounter (HOSPITAL_COMMUNITY)
Admission: RE | Disposition: A | Discharge status: Home / Self Care | Payer: Self-pay | Source: Ambulatory Visit | Attending: Cardiology

## 2017-08-23 ENCOUNTER — Encounter (HOSPITAL_COMMUNITY): Payer: Self-pay | Admitting: *Deleted

## 2017-08-23 ENCOUNTER — Ambulatory Visit (HOSPITAL_BASED_OUTPATIENT_CLINIC_OR_DEPARTMENT_OTHER)
Admission: RE | Admit: 2017-08-23 | Discharge: 2017-08-23 | Disposition: A | Discharge status: Home / Self Care | Payer: Medicare Other | Source: Ambulatory Visit | Attending: Cardiology | Admitting: Cardiology

## 2017-08-23 ENCOUNTER — Ambulatory Visit (HOSPITAL_COMMUNITY)
Admission: RE | Admit: 2017-08-23 | Discharge: 2017-08-23 | Disposition: A | Discharge status: Home / Self Care | Payer: Medicare Other | Source: Ambulatory Visit | Attending: Cardiology | Admitting: Cardiology

## 2017-08-23 DIAGNOSIS — E785 Hyperlipidemia, unspecified: Secondary | ICD-10-CM | POA: Diagnosis not present

## 2017-08-23 DIAGNOSIS — M8580 Other specified disorders of bone density and structure, unspecified site: Secondary | ICD-10-CM | POA: Insufficient documentation

## 2017-08-23 DIAGNOSIS — I4510 Unspecified right bundle-branch block: Secondary | ICD-10-CM | POA: Diagnosis not present

## 2017-08-23 DIAGNOSIS — Q211 Atrial septal defect: Secondary | ICD-10-CM | POA: Diagnosis not present

## 2017-08-23 DIAGNOSIS — I340 Nonrheumatic mitral (valve) insufficiency: Secondary | ICD-10-CM | POA: Insufficient documentation

## 2017-08-23 DIAGNOSIS — E039 Hypothyroidism, unspecified: Secondary | ICD-10-CM | POA: Diagnosis not present

## 2017-08-23 DIAGNOSIS — J45909 Unspecified asthma, uncomplicated: Secondary | ICD-10-CM | POA: Diagnosis not present

## 2017-08-23 DIAGNOSIS — Z6841 Body Mass Index (BMI) 40.0 and over, adult: Secondary | ICD-10-CM | POA: Diagnosis not present

## 2017-08-23 DIAGNOSIS — R0902 Hypoxemia: Secondary | ICD-10-CM | POA: Diagnosis not present

## 2017-08-23 DIAGNOSIS — R9431 Abnormal electrocardiogram [ECG] [EKG]: Secondary | ICD-10-CM | POA: Diagnosis not present

## 2017-08-23 DIAGNOSIS — M1990 Unspecified osteoarthritis, unspecified site: Secondary | ICD-10-CM | POA: Diagnosis not present

## 2017-08-23 DIAGNOSIS — Z7984 Long term (current) use of oral hypoglycemic drugs: Secondary | ICD-10-CM | POA: Diagnosis not present

## 2017-08-23 DIAGNOSIS — Z882 Allergy status to sulfonamides status: Secondary | ICD-10-CM | POA: Insufficient documentation

## 2017-08-23 DIAGNOSIS — E119 Type 2 diabetes mellitus without complications: Secondary | ICD-10-CM | POA: Diagnosis not present

## 2017-08-23 DIAGNOSIS — K219 Gastro-esophageal reflux disease without esophagitis: Secondary | ICD-10-CM | POA: Insufficient documentation

## 2017-08-23 DIAGNOSIS — Z8249 Family history of ischemic heart disease and other diseases of the circulatory system: Secondary | ICD-10-CM | POA: Diagnosis not present

## 2017-08-23 HISTORY — PX: TEE WITHOUT CARDIOVERSION: SHX5443

## 2017-08-23 LAB — GLUCOSE, CAPILLARY: GLUCOSE-CAPILLARY: 132 mg/dL — AB (ref 65–99)

## 2017-08-23 SURGERY — ECHOCARDIOGRAM, TRANSESOPHAGEAL
Anesthesia: Moderate Sedation

## 2017-08-23 MED ORDER — FENTANYL CITRATE (PF) 100 MCG/2ML IJ SOLN
INTRAMUSCULAR | Status: AC
  Filled 2017-08-23: qty 2

## 2017-08-23 MED ORDER — SODIUM CHLORIDE 0.9 % IV SOLN: INTRAVENOUS | Status: DC

## 2017-08-23 MED ORDER — MIDAZOLAM HCL 10 MG/2ML IJ SOLN
INTRAMUSCULAR | Status: DC | PRN
  Administered 2017-08-23: 1 mg via INTRAVENOUS
  Administered 2017-08-23: 2 mg via INTRAVENOUS

## 2017-08-23 MED ORDER — MIDAZOLAM HCL 5 MG/ML IJ SOLN
INTRAMUSCULAR | Status: AC
  Filled 2017-08-23: qty 2

## 2017-08-23 MED ORDER — FENTANYL CITRATE (PF) 100 MCG/2ML IJ SOLN
INTRAMUSCULAR | Status: DC | PRN
  Administered 2017-08-23: 25 ug via INTRAVENOUS

## 2017-08-23 NOTE — CV Procedure (Signed)
   Transesophageal Echocardiogram  Indications:?PFO  Time out performed  During this procedure the patient is administered a total of Versed 3 mg and Fentanyl 25 mcg to achieve and maintain moderate conscious sedation.  The patient's heart rate, blood pressure, and oxygen saturation are monitored continuously during the procedure. The period of conscious sedation is 25 minutes, of which I was present face-to-face 100% of this time.  Findings:  Left Ventricle: Normal EF, 60%  Mitral Valve: Trace mitral regurgitation  Aortic Valve: Normal  Tricuspid Valve: Trace TR  Left Atrium: Normal, no left atrial appendage thrombus  Bubble Contrast Study: Normal, no PFO.  There is bowing of the septum from left to right.  Monica Furbish, MD

## 2017-08-23 NOTE — Interval H&P Note (Signed)
History and Physical Interval Note:  08/23/2017 9:50 AM  [name]  has presented today for surgery, with the diagnosis of NOCTURNAL HYPOXIA  The various methods of treatment have been discussed with the patient and family. After consideration of risks, benefits and other options for treatment, the patient has consented to  Procedure(s): TRANSESOPHAGEAL ECHOCARDIOGRAM (TEE) (N/A) as a surgical intervention .  The patient's history has been reviewed, patient examined, no change in status, stable for surgery.  I have reviewed the patient's chart and labs.  Questions were answered to the patient's satisfaction.    Echocardiogram presented possible PFO.  We are performing the TEE to exclude shunt.   [employer]

## 2017-08-23 NOTE — Discharge Instructions (Signed)

## 2017-08-24 ENCOUNTER — Encounter (HOSPITAL_COMMUNITY): Payer: Self-pay | Admitting: Cardiology

## 2017-09-12 ENCOUNTER — Ambulatory Visit (INDEPENDENT_AMBULATORY_CARE_PROVIDER_SITE_OTHER): Payer: Medicare Other | Admitting: Psychology

## 2017-09-12 DIAGNOSIS — F4321 Adjustment disorder with depressed mood: Secondary | ICD-10-CM

## 2017-09-14 ENCOUNTER — Telehealth: Payer: Self-pay | Admitting: *Deleted

## 2017-09-14 NOTE — Telephone Encounter (Signed)
ONO received from [facility]. Sent to medical records to be scanned and for Dr Radford Pax to review.

## 2017-09-16 ENCOUNTER — Telehealth: Payer: Self-pay | Admitting: *Deleted

## 2017-09-16 NOTE — Telephone Encounter (Signed)
-----   Message from Teressa Senter, RN sent at 09/16/2017  8:23 AM EDT ----- To Gae Bon to follow up

## 2017-09-16 NOTE — Telephone Encounter (Signed)
Order faxed to [facility]. Per Corene Cornea at [facility] order is still in range to be submitted.

## 2017-09-16 NOTE — Telephone Encounter (Signed)
Patient has decided to stop treatment with [facility] for now and go see her PCP for a referral for a pulmonologist because she feels it could be asthma, or chronic bronchitis causing her oxygen to desat at night which has been diagnosed earlier by her PCP.

## 2017-09-19 DIAGNOSIS — E039 Hypothyroidism, unspecified: Secondary | ICD-10-CM | POA: Diagnosis not present

## 2017-09-19 DIAGNOSIS — G4734 Idiopathic sleep related nonobstructive alveolar hypoventilation: Secondary | ICD-10-CM | POA: Diagnosis not present

## 2017-09-19 DIAGNOSIS — Z0000 Encounter for general adult medical examination without abnormal findings: Secondary | ICD-10-CM | POA: Diagnosis not present

## 2017-09-19 DIAGNOSIS — Z1211 Encounter for screening for malignant neoplasm of colon: Secondary | ICD-10-CM | POA: Diagnosis not present

## 2017-09-19 DIAGNOSIS — Z1389 Encounter for screening for other disorder: Secondary | ICD-10-CM | POA: Diagnosis not present

## 2017-09-19 DIAGNOSIS — G5601 Carpal tunnel syndrome, right upper limb: Secondary | ICD-10-CM | POA: Diagnosis not present

## 2017-09-19 DIAGNOSIS — E7800 Pure hypercholesterolemia, unspecified: Secondary | ICD-10-CM | POA: Diagnosis not present

## 2017-09-19 DIAGNOSIS — E119 Type 2 diabetes mellitus without complications: Secondary | ICD-10-CM | POA: Diagnosis not present

## 2017-09-22 NOTE — Telephone Encounter (Signed)
    Patient has decided to stop treatment with [facility] for now and go see her PCP for a referral for a pulmonologist because she feels it could be asthma, or chronic bronchitis causing her oxygen to desat at night which has been diagnosed earlier by her PCP.

## 2017-10-14 ENCOUNTER — Ambulatory Visit (INDEPENDENT_AMBULATORY_CARE_PROVIDER_SITE_OTHER): Payer: Medicare Other | Admitting: Psychology

## 2017-10-14 DIAGNOSIS — L738 Other specified follicular disorders: Secondary | ICD-10-CM | POA: Diagnosis not present

## 2017-10-14 DIAGNOSIS — D2272 Melanocytic nevi of left lower limb, including hip: Secondary | ICD-10-CM | POA: Diagnosis not present

## 2017-10-14 DIAGNOSIS — F4321 Adjustment disorder with depressed mood: Secondary | ICD-10-CM

## 2017-10-14 DIAGNOSIS — D2261 Melanocytic nevi of right upper limb, including shoulder: Secondary | ICD-10-CM | POA: Diagnosis not present

## 2017-10-14 DIAGNOSIS — D225 Melanocytic nevi of trunk: Secondary | ICD-10-CM | POA: Diagnosis not present

## 2017-10-14 DIAGNOSIS — L918 Other hypertrophic disorders of the skin: Secondary | ICD-10-CM | POA: Diagnosis not present

## 2017-10-14 DIAGNOSIS — L821 Other seborrheic keratosis: Secondary | ICD-10-CM | POA: Diagnosis not present

## 2017-10-14 DIAGNOSIS — Z85828 Personal history of other malignant neoplasm of skin: Secondary | ICD-10-CM | POA: Diagnosis not present

## 2017-10-14 DIAGNOSIS — D2271 Melanocytic nevi of right lower limb, including hip: Secondary | ICD-10-CM | POA: Diagnosis not present

## 2017-10-14 DIAGNOSIS — D1801 Hemangioma of skin and subcutaneous tissue: Secondary | ICD-10-CM | POA: Diagnosis not present

## 2017-10-14 DIAGNOSIS — L720 Epidermal cyst: Secondary | ICD-10-CM | POA: Diagnosis not present

## 2017-11-08 ENCOUNTER — Ambulatory Visit (INDEPENDENT_AMBULATORY_CARE_PROVIDER_SITE_OTHER): Payer: Medicare Other | Admitting: Psychology

## 2017-11-08 DIAGNOSIS — F4321 Adjustment disorder with depressed mood: Secondary | ICD-10-CM

## 2017-11-09 DIAGNOSIS — E119 Type 2 diabetes mellitus without complications: Secondary | ICD-10-CM | POA: Diagnosis not present

## 2017-11-09 DIAGNOSIS — H401233 Low-tension glaucoma, bilateral, severe stage: Secondary | ICD-10-CM | POA: Diagnosis not present

## 2017-11-09 DIAGNOSIS — H25013 Cortical age-related cataract, bilateral: Secondary | ICD-10-CM | POA: Diagnosis not present

## 2017-11-09 DIAGNOSIS — H2513 Age-related nuclear cataract, bilateral: Secondary | ICD-10-CM | POA: Diagnosis not present

## 2017-11-21 ENCOUNTER — Encounter: Payer: Self-pay | Admitting: Pulmonary Disease

## 2017-11-21 ENCOUNTER — Ambulatory Visit (INDEPENDENT_AMBULATORY_CARE_PROVIDER_SITE_OTHER): Payer: Medicare Other | Admitting: Pulmonary Disease

## 2017-11-21 VITALS — BP 114/68 | HR 75 | Ht 63.0 in | Wt 245.0 lb

## 2017-11-21 DIAGNOSIS — G4734 Idiopathic sleep related nonobstructive alveolar hypoventilation: Secondary | ICD-10-CM | POA: Diagnosis not present

## 2017-11-21 NOTE — Progress Notes (Signed)
[facility]  Chief Complaint  Patient presents with  . sleep consult    Pt states she doesn't have OSA, had cardiology work up, and O2 sats drop in the 80% at night. Pt requesting to find out why O2 drops so low at night.    Constitutional: BP 114/68 (BP Location: Left Arm, Cuff Size: Normal)   Pulse 75   Ht 5\' 3"  (1.6 m)   Wt 245 lb (111.1 kg)   SpO2 96%   BMI 43.40 kg/m   History of Present Illness: [name] is a 67 y.o. female with nocturnal hypoxia.  She was seen by cardiology for abnormal ECG.  She was noted to have snoring.  She had home sleep study.   This did show few respiratory events, but not frequent enough to qualify for sleep apnea diagnosis.  She had overnight oximetry that showed significant oxygen desaturation.  She had in lab sleep study that showed similar findings to home sleep study.  She denies cough, wheeze, sputum.  Never smoked.  Had asthma as a child.  Has occasional snoring.  Not aware of any breathing troubles while asleep.  Doesn't feel like she has issue with her sleep.  She wears nail polish all the time.  She is almost certain that she was wearing nail polish when she had her oxygen levels tested overnight.   Comprehensive Respiratory Exam:  Appearance - well kempt  ENMT - nasal mucosa moist, turbinates clear, midline nasal septum, no dental lesions, no gingival bleeding, no oral exudates, no tonsillar hypertrophy Neck - no masses, trachea midline, no thyromegaly, no elevation in JVP, scar over anterior neck Respiratory - normal appearance of chest wall, normal respiratory effort w/o accessory muscle use, no dullness on percussion, no tactile fremitus, no wheezing or rales CV - s1s2 regular rate and rhythm, no murmurs, no peripheral edema, radial pulses symmetric GI - soft, non tender, no masses, no hepatosplenomegaly Lymph - no adenopathy noted in neck and axillary areas MSK - normal muscle strength  and tone, normal gait Ext - no cyanosis, clubbing, or joint inflammation noted Skin - no rashes, lesions, or ulcers Neuro - oriented to person, place, and time Psych - normal mood and affect  Discussion: She was found to have oxygen desaturation on overnight oximetry.  She thinks she was wearing nail polish when she had this testing done.  She doesn't have any other findings to suggest respiratory condition, and multiple sleep studies were negative for sleep apnea.  It is possible her oxygen desaturation during overnight oximetry was inaccurate.  Assessment/Plan:  Nocturnal hypoxemia. - will repeat overnight oximetry on room air - advised her to not wear nail polish on night she does overnight oximetry - if her oxygen is low, then will need CXR and PFT to further assess for respiratory condition  Obesity. - discussed how her weight could be impacting her breathing and oxygen level - reviewed options to assist with weight loss   Patient Instructions  Will arrange for overnight oxygen test - please make sure you aren't wearing nail polish on the night of the test  Will call with results and then schedule follow up if needed    Chesley Mires, MD [place] 11/21/2017, 12:14 PM  Flow Sheet  Pulmonary tests:  Sleep tests: HST 11/06/16 >> AHI 2.8, SpO2 low 85% ONO with RA 11/08/16 >> Average SpO2 88%, low SpO2 73%, Spent 5 hrs 7 min with SpO2 < 88%  PSG 02/17/17 >> AHI 2.2, SpO2 low 80%  Cardiac tests: Echo bubble study 08/17/17 >> EF 55 to 60%, mod LVH, grade 1 DD, no shunt  Review of Systems: Review of Systems  Constitutional: Negative for fever and unexpected weight change.  HENT: Positive for sinus pressure. Negative for congestion, dental problem, ear pain, nosebleeds, postnasal drip, rhinorrhea, sneezing, sore throat and trouble swallowing.   Eyes: Negative for redness and itching.  Respiratory: Negative for cough, chest tightness, shortness of breath and  wheezing.   Cardiovascular: Negative for palpitations and leg swelling.  Gastrointestinal: Negative for nausea and vomiting.  Genitourinary: Negative for dysuria.  Musculoskeletal: Negative for joint swelling.  Skin: Negative for rash.  Allergic/Immunologic: Positive for environmental allergies. Negative for food allergies and immunocompromised state.  Neurological: Negative for headaches.  Hematological: Does not bruise/bleed easily.  Psychiatric/Behavioral: Negative for dysphoric mood. The patient is nervous/anxious.    Past Medical History: She  has a past medical history of Abnormal EKG (10/06/2016), Abnormal nuclear stress test (10/28/2016), Asthma, Atypical chest pain, Diabetes mellitus without complication ([place]), DJD (degenerative joint disease), DM (diabetes mellitus) ([place]) (10/28/2016), Family history of early CAD (10/06/2016), GERD (gastroesophageal reflux disease), H/O squamous cell carcinoma, Hyperlipidemia, Hypothyroidism, Morbid obesity ([place]), Nocturnal hypoxemia (08/10/2017), Osteopenia, Severe stage glaucoma, Snoring (10/06/2016), Thyroid disease, and Thyroid nodule.  Past Surgical History: She  has a past surgical history that includes Cholecystectomy; Tonsillectomy; Tubal ligation; THYROID NODULE REMOVAL; S/P COLONOSCOPY (06/2011); Breast reduction surgery; Knee arthroscopy (Left); Wrist surgery (Left); LEFT HEART CATH AND CORONARY ANGIOGRAPHY (N/A, 11/05/2016); Reduction mammaplasty (Bilateral); and TEE without cardioversion (N/A, 08/23/2017).  Family History: Her family history includes Heart disease in her father.  Social History: She  reports that she has never smoked. She has never used smokeless tobacco. She reports that she drinks alcohol. She reports that she does not use drugs.  Medications: Allergies as of 11/21/2017      Reactions   Ciprofloxacin Other (See Comments)   Dizziness/vertigo   Sulfa Antibiotics Swelling   Sulfasalazine Swelling      Medication List         Accurate as of 11/21/17 12:14 PM. Always use your most recent med list.          brimonidine 0.2 % ophthalmic solution Commonly known as:  ALPHAGAN Place 1 drop into both eyes 2 (two) times daily.   carboxymethylcellulose 0.5 % Soln Commonly known as:  REFRESH PLUS Place 1 drop into both eyes 3 (three) times daily as needed (for dry eyes).   cetirizine 10 MG tablet Commonly known as:  ZYRTEC Take 10 mg by mouth at bedtime.   diclofenac sodium 1 % Gel Commonly known as:  VOLTAREN Apply 1-2 g topically 3 (three) times daily as needed (for knee pain.).   esomeprazole 20 MG capsule Commonly known as:  NEXIUM Take 20 mg by mouth daily as needed (for acid reflux/indigestion).   FISH OIL ULTRA PO Take 1,200 mg by mouth every Tuesday, Thursday, Saturday, and Sunday at 6 PM.   glimepiride 4 MG tablet Commonly known as:  AMARYL Take 4 mg by mouth 2 (two) times daily.   ibuprofen 200 MG tablet Commonly known as:  ADVIL,MOTRIN Take 400-800 mg by mouth every 8 (eight) hours as needed for headache or moderate pain.   JARDIANCE 10 MG Tabs tablet Generic drug:  empagliflozin Take 10 mg by mouth daily.   latanoprost 0.005 % ophthalmic solution Commonly known as:  XALATAN Place 1 drop into both eyes at  bedtime.   levothyroxine 150 MCG tablet Commonly known as:  SYNTHROID, LEVOTHROID Take 150 mcg by mouth daily before breakfast.   metFORMIN 500 MG tablet Commonly known as:  GLUCOPHAGE Take 1,000 mg by mouth 2 (two) times daily with a meal.   methocarbamol 500 MG tablet Commonly known as:  ROBAXIN Take 1 tablet (500 mg total) by mouth 2 (two) times daily.   multivitamin with minerals Tabs tablet Take 1 tablet by mouth at bedtime. CENTRUM   pregabalin 50 MG capsule Commonly known as:  LYRICA Take 50 mg by mouth every Monday, Wednesday, and Friday. In the morning   simvastatin 40 MG tablet Commonly known as:  ZOCOR Take 40 mg by mouth every Monday, Wednesday, and Friday  at 8 PM.   Vitamin D-3 5000 units Tabs Take 5,000 Units by mouth every Monday, Wednesday, and Friday.

## 2017-11-21 NOTE — Progress Notes (Signed)
   Subjective:    Patient ID: [name], female    DOB: 11-May-1950, 67 y.o.   MRN: [id number]  HPI    Review of Systems  Constitutional: Negative for fever and unexpected weight change.  HENT: Positive for sinus pressure. Negative for congestion, dental problem, ear pain, nosebleeds, postnasal drip, rhinorrhea, sneezing, sore throat and trouble swallowing.   Eyes: Negative for redness and itching.  Respiratory: Negative for cough, chest tightness, shortness of breath and wheezing.   Cardiovascular: Negative for palpitations and leg swelling.  Gastrointestinal: Negative for nausea and vomiting.  Genitourinary: Negative for dysuria.  Musculoskeletal: Negative for joint swelling.  Skin: Negative for rash.  Allergic/Immunologic: Positive for environmental allergies. Negative for food allergies and immunocompromised state.  Neurological: Negative for headaches.  Hematological: Does not bruise/bleed easily.  Psychiatric/Behavioral: Negative for dysphoric mood. The patient is nervous/anxious.        Objective:   Physical Exam        Assessment & Plan:

## 2017-11-21 NOTE — Patient Instructions (Signed)
Will arrange for overnight oxygen test - please make sure you aren't wearing nail polish on the night of the test  Will call with results and then schedule follow up if needed

## 2017-12-01 ENCOUNTER — Encounter: Payer: Self-pay | Admitting: Pulmonary Disease

## 2017-12-01 DIAGNOSIS — J449 Chronic obstructive pulmonary disease, unspecified: Secondary | ICD-10-CM | POA: Diagnosis not present

## 2017-12-01 DIAGNOSIS — R0902 Hypoxemia: Secondary | ICD-10-CM | POA: Diagnosis not present

## 2017-12-02 ENCOUNTER — Telehealth: Payer: Self-pay | Admitting: Pulmonary Disease

## 2017-12-02 DIAGNOSIS — J9611 Chronic respiratory failure with hypoxia: Secondary | ICD-10-CM

## 2017-12-02 DIAGNOSIS — E662 Morbid (severe) obesity with alveolar hypoventilation: Secondary | ICD-10-CM

## 2017-12-02 NOTE — Telephone Encounter (Signed)
rec'd fax from [place] for pt's Overnight Oximetry report Placed in VS green to do folder for review

## 2017-12-07 DIAGNOSIS — J9611 Chronic respiratory failure with hypoxia: Secondary | ICD-10-CM | POA: Insufficient documentation

## 2017-12-07 DIAGNOSIS — E662 Morbid (severe) obesity with alveolar hypoventilation: Secondary | ICD-10-CM | POA: Insufficient documentation

## 2017-12-07 NOTE — Telephone Encounter (Signed)
ONO with RA 12/01/17 >> test time 7 hrs 54 min.  Baseline SpO2 90%, low SpO2 82%.  Spent 2 hrs 4 min with SpO2 < 88%.   Please let her know that her oxygen level is still low at night.  If she is agreeable, then please arrange for a chest xray and pulmonary function test.  Please also arrange to have her set up for 2 liters oxygen at night.  Can use diagnosis codes of chronic respiratory failure with hypoxia (J96.11), and obesity hypoventilation syndrome (E66.2).  She will need follow up with me or NP in 2 to 3 weeks.

## 2017-12-07 NOTE — Telephone Encounter (Signed)
Called and spoke with patient regarding results.  Informed the patient of results and recommendations today. Scheduled PFT and OV for 12/23/17 at 11am Pt knows to arrive at 10:25am prior to [place] and have CXR sameday prior to PFT. Placed CXR; advised pt to go to xray prior to ov Pt verbalized understanding and denied any questions or concerns at this time.  Nothing further needed.

## 2017-12-14 DIAGNOSIS — M1711 Unilateral primary osteoarthritis, right knee: Secondary | ICD-10-CM | POA: Diagnosis not present

## 2017-12-15 DIAGNOSIS — H2512 Age-related nuclear cataract, left eye: Secondary | ICD-10-CM | POA: Diagnosis not present

## 2017-12-15 DIAGNOSIS — H25013 Cortical age-related cataract, bilateral: Secondary | ICD-10-CM | POA: Diagnosis not present

## 2017-12-15 DIAGNOSIS — H2513 Age-related nuclear cataract, bilateral: Secondary | ICD-10-CM | POA: Diagnosis not present

## 2017-12-15 DIAGNOSIS — H35372 Puckering of macula, left eye: Secondary | ICD-10-CM | POA: Diagnosis not present

## 2017-12-15 DIAGNOSIS — H40033 Anatomical narrow angle, bilateral: Secondary | ICD-10-CM | POA: Diagnosis not present

## 2017-12-15 DIAGNOSIS — H21562 Pupillary abnormality, left eye: Secondary | ICD-10-CM | POA: Diagnosis not present

## 2017-12-23 ENCOUNTER — Ambulatory Visit (INDEPENDENT_AMBULATORY_CARE_PROVIDER_SITE_OTHER)
Admission: RE | Admit: 2017-12-23 | Discharge: 2017-12-23 | Disposition: A | Discharge status: Home / Self Care | Payer: Medicare Other | Source: Ambulatory Visit | Attending: Pulmonary Disease | Admitting: Pulmonary Disease

## 2017-12-23 ENCOUNTER — Ambulatory Visit (INDEPENDENT_AMBULATORY_CARE_PROVIDER_SITE_OTHER): Payer: Medicare Other | Admitting: Pulmonary Disease

## 2017-12-23 ENCOUNTER — Encounter: Payer: Self-pay | Admitting: Pulmonary Disease

## 2017-12-23 VITALS — BP 124/72 | HR 77 | Ht 63.0 in | Wt 242.0 lb

## 2017-12-23 DIAGNOSIS — J9611 Chronic respiratory failure with hypoxia: Secondary | ICD-10-CM

## 2017-12-23 DIAGNOSIS — J449 Chronic obstructive pulmonary disease, unspecified: Secondary | ICD-10-CM

## 2017-12-23 DIAGNOSIS — E662 Morbid (severe) obesity with alveolar hypoventilation: Secondary | ICD-10-CM

## 2017-12-23 LAB — PULMONARY FUNCTION TEST
DL/VA % pred: 118 %
DL/VA: 5.62 ml/min/mmHg/L
DLCO UNC % PRED: 104 %
DLCO UNC: 24.62 ml/min/mmHg
FEF 25-75 PRE: 0.88 L/s
FEF 25-75 Post: 1.54 L/sec
FEF2575-%Change-Post: 75 %
FEF2575-%PRED-PRE: 44 %
FEF2575-%Pred-Post: 77 %
FEV1-%Change-Post: 20 %
FEV1-%PRED-POST: 69 %
FEV1-%Pred-Pre: 57 %
FEV1-POST: 1.6 L
FEV1-Pre: 1.33 L
FEV1FVC-%Change-Post: -7 %
FEV1FVC-%Pred-Pre: 91 %
FEV6-%CHANGE-POST: 23 %
FEV6-%PRED-POST: 80 %
FEV6-%Pred-Pre: 64 %
FEV6-PRE: 1.87 L
FEV6-Post: 2.32 L
FEV6FVC-%CHANGE-POST: -3 %
FEV6FVC-%PRED-PRE: 104 %
FEV6FVC-%Pred-Post: 100 %
FVC-%Change-Post: 29 %
FVC-%Pred-Post: 81 %
FVC-%Pred-Pre: 63 %
FVC-Post: 2.48 L
FVC-Pre: 1.91 L
POST FEV1/FVC RATIO: 65 %
PRE FEV1/FVC RATIO: 70 %
Post FEV6/FVC ratio: 97 %
Pre FEV6/FVC Ratio: 100 %
RV % PRED: 115 %
RV: 2.44 L
TLC % pred: 92 %
TLC: 4.61 L

## 2017-12-23 MED ORDER — FLUTICASONE FUROATE 100 MCG/ACT IN AEPB: 1.0000 | INHALATION_SPRAY | Freq: Every day | RESPIRATORY_TRACT | 5 refills | Status: DC

## 2017-12-23 MED ORDER — FLUTICASONE FUROATE 200 MCG/ACT IN AEPB: 1.0000 | INHALATION_SPRAY | Freq: Every day | RESPIRATORY_TRACT | 0 refills | Status: DC

## 2017-12-23 NOTE — Progress Notes (Signed)
PFT done today. 

## 2017-12-23 NOTE — Progress Notes (Signed)
[facility]  Chief Complaint  Patient presents with  . Follow-up    Constitutional: BP 124/72 (BP Location: Left Arm, Cuff Size: Normal)   Pulse 77   Ht 5\' 3"  (1.6 m)   Wt 242 lb (109.8 kg)   SpO2 95%   BMI 42.87 kg/m   History of Present Illness: [name] is a 67 y.o. female with nocturnal hypoxia.  She had overnight oximetry.  This showed persistent hypoxemia.  PFT today showed mild to moderate obstruction with significant improvement after bronchodilator challenge.  She had asthma as a child, but feels she outgrew this.  She does get asthmatic bronchitis and needs inhalers when she has a respiratory infection.  She is prone to getting seasonal allergies.  Her aunt had COPD from asthma.  She is scheduled to have cataracts surgery.  She is not having cough, wheeze, sputum, chest pain, fever, dyspnea, or skin rash.  Her daughter is getting married next week.   Comprehensive Respiratory Exam:  Appearance - well kempt  ENMT - nasal mucosa moist, turbinates clear, midline nasal septum, no dental lesions, no gingival bleeding, no oral exudates, no tonsillar hypertrophy Neck - no masses, trachea midline, no thyromegaly, no elevation in JVP Respiratory - normal appearance of chest wall, normal respiratory effort w/o accessory muscle use, no dullness on percussion, no wheezing or rales CV - s1s2 regular rate and rhythm, no murmurs, no peripheral edema, radial pulses symmetric GI - soft, non tender, no masses Lymph - no adenopathy noted in neck and axillary areas MSK - normal muscle strength and tone, normal gait Ext - no cyanosis, clubbing, or joint inflammation noted Skin - no rashes, lesions, or ulcers Neuro - oriented to person, place, and time Psych - normal mood and affect   Discussion: She has nocturnal hypoxemia.  Her BMI is 31.51 and she certainly could have hypoventilation in setting of obesity as a cause of her  oxygen needs at night.  She also has significant airflow obstruction with bronchodilator responsiveness on PFT, and likely has COPD from asthma which could also be contributing to her nocturnal oxygen needs to some degree.  Assessment/Plan:  COPD with asthma. - will have her try arnuity >> given sample of 200 strength, but then change to 100 strength once sample done - repeat spirometry at next visit  Nocturnal hypoxemia with probable obesity hypoventilation syndrome. - continue 2 liters oxygen at night  Obesity. - discussed options to assist with weight loss   Patient Instructions  Arnuity 1 puff daily and rinse mouth after each use  Follow up in 6 weeks with spirometry    Chesley Mires, MD [place] 12/23/2017, 12:29 PM  Flow Sheet  Pulmonary tests: PFT 12/23/17 >> FEV1 1.60 (69%), FEV1% 65, TLC 4.61 (92%), DLCO 104%, +BD  Sleep tests: HST 11/06/16 >> AHI 2.8, SpO2 low 85% ONO with RA 11/08/16 >> Average SpO2 88%, low SpO2 73%, Spent 5 hrs 7 min with SpO2 < 88% PSG 02/17/17 >> AHI 2.2, SpO2 low 80% ONO with RA 12/01/17 >> test time 7 hrs 54 min.  Baseline SpO2 90%, low SpO2 82%.  Spent 2 hrs 4 min with SpO2 < 88%.  Cardiac tests: Echo bubble study 08/17/17 >> EF 55 to 60%, mod LVH, grade 1 DD, no shunt  Past Medical History: She  has a past medical history of Abnormal EKG (10/06/2016), Abnormal nuclear stress test (10/28/2016), Asthma, Atypical chest pain, Diabetes mellitus without complication ([place]),  DJD (degenerative joint disease), DM (diabetes mellitus) ([place]) (10/28/2016), Family history of early CAD (10/06/2016), GERD (gastroesophageal reflux disease), H/O squamous cell carcinoma, Hyperlipidemia, Hypothyroidism, Morbid obesity ([place]), Nocturnal hypoxemia (08/10/2017), Osteopenia, Severe stage glaucoma, Snoring (10/06/2016), Thyroid disease, and Thyroid nodule.  Past Surgical History: She  has a past surgical history that includes Cholecystectomy; Tonsillectomy;  Tubal ligation; THYROID NODULE REMOVAL; S/P COLONOSCOPY (06/2011); Breast reduction surgery; Knee arthroscopy (Left); Wrist surgery (Left); LEFT HEART CATH AND CORONARY ANGIOGRAPHY (N/A, 11/05/2016); Reduction mammaplasty (Bilateral); and TEE without cardioversion (N/A, 08/23/2017).  Family History: Her family history includes Heart disease in her father.  Social History: She  reports that she has never smoked. She has never used smokeless tobacco. She reports that she drinks alcohol. She reports that she does not use drugs.  Medications: Allergies as of 12/23/2017      Reactions   Ciprofloxacin Other (See Comments)   Dizziness/vertigo   Sulfa Antibiotics Swelling   Sulfasalazine Swelling      Medication List        Accurate as of 12/23/17 12:29 PM. Always use your most recent med list.          brimonidine 0.2 % ophthalmic solution Commonly known as:  ALPHAGAN Place 1 drop into both eyes 2 (two) times daily.   carboxymethylcellulose 0.5 % Soln Commonly known as:  REFRESH PLUS Place 1 drop into both eyes 3 (three) times daily as needed (for dry eyes).   cetirizine 10 MG tablet Commonly known as:  ZYRTEC Take 10 mg by mouth at bedtime.   diclofenac sodium 1 % Gel Commonly known as:  VOLTAREN Apply 1-2 g topically 3 (three) times daily as needed (for knee pain.).   esomeprazole 20 MG capsule Commonly known as:  NEXIUM Take 20 mg by mouth daily as needed (for acid reflux/indigestion).   FISH OIL ULTRA PO Take 1,200 mg by mouth every Tuesday, Thursday, Saturday, and Sunday at 6 PM.   Fluticasone Furoate 100 MCG/ACT Aepb Inhale 1 puff into the lungs daily.   glimepiride 4 MG tablet Commonly known as:  AMARYL Take 4 mg by mouth 2 (two) times daily.   ibuprofen 200 MG tablet Commonly known as:  ADVIL,MOTRIN Take 400-800 mg by mouth every 8 (eight) hours as needed for headache or moderate pain.   JARDIANCE 10 MG Tabs tablet Generic drug:  empagliflozin Take 10 mg by  mouth daily.   latanoprost 0.005 % ophthalmic solution Commonly known as:  XALATAN Place 1 drop into both eyes at bedtime.   levothyroxine 150 MCG tablet Commonly known as:  SYNTHROID, LEVOTHROID Take 150 mcg by mouth daily before breakfast.   metFORMIN 500 MG tablet Commonly known as:  GLUCOPHAGE Take 1,000 mg by mouth 2 (two) times daily with a meal.   methocarbamol 500 MG tablet Commonly known as:  ROBAXIN Take 1 tablet (500 mg total) by mouth 2 (two) times daily.   multivitamin with minerals Tabs tablet Take 1 tablet by mouth at bedtime. CENTRUM   pregabalin 50 MG capsule Commonly known as:  LYRICA Take 50 mg by mouth every Monday, Wednesday, and Friday. In the morning   simvastatin 40 MG tablet Commonly known as:  ZOCOR Take 40 mg by mouth every Monday, Wednesday, and Friday at 8 PM.   Vitamin D-3 5000 units Tabs Take 5,000 Units by mouth every Monday, Wednesday, and Friday.

## 2017-12-23 NOTE — Patient Instructions (Addendum)
Arnuity 1 puff daily and rinse mouth after each use  Follow up in 6 weeks with spirometry

## 2018-01-03 ENCOUNTER — Telehealth: Payer: Self-pay | Admitting: Pulmonary Disease

## 2018-01-03 DIAGNOSIS — R911 Solitary pulmonary nodule: Secondary | ICD-10-CM

## 2018-01-03 NOTE — Telephone Encounter (Signed)
Dg Chest 2 View  Result Date: 12/23/2017 CLINICAL DATA:  Chronic respiratory failure with hypoxia. EXAM: CHEST - 2 VIEW COMPARISON:  Radiograph of February 19, 2016. FINDINGS: The heart size and mediastinal contours are within normal limits. No pneumothorax or pleural effusion is noted. Left lung is clear. Ill-defined nodular density is noted in right upper lobe. No consolidative process is noted. The visualized skeletal structures are unremarkable. IMPRESSION: Ill-defined nodular density seen in right upper lobe. CT scan of the chest is recommended to rule out pulmonary nodule or mass. No other abnormality is noted. Electronically Signed   By: Marijo Conception, M.D.   On: 12/23/2017 15:24     Results d/w pt.  Will arrange for CT chest with IV contrast.

## 2018-01-04 ENCOUNTER — Ambulatory Visit: Payer: Medicare Other | Admitting: Psychology

## 2018-01-06 ENCOUNTER — Other Ambulatory Visit (INDEPENDENT_AMBULATORY_CARE_PROVIDER_SITE_OTHER): Payer: Medicare Other

## 2018-01-06 DIAGNOSIS — R911 Solitary pulmonary nodule: Secondary | ICD-10-CM

## 2018-01-06 LAB — BASIC METABOLIC PANEL
BUN: 13 mg/dL (ref 6–23)
CALCIUM: 10.3 mg/dL (ref 8.4–10.5)
CO2: 31 mEq/L (ref 19–32)
CREATININE: 0.59 mg/dL (ref 0.40–1.20)
Chloride: 102 mEq/L (ref 96–112)
GFR: 107.88 mL/min (ref >60.00)
Glucose, Bld: 168 mg/dL — ABNORMAL HIGH (ref 70–99)
Potassium: 4.5 mEq/L (ref 3.5–5.1)
Sodium: 137 mEq/L (ref 135–145)

## 2018-01-10 DIAGNOSIS — H25812 Combined forms of age-related cataract, left eye: Secondary | ICD-10-CM | POA: Diagnosis not present

## 2018-01-10 DIAGNOSIS — H2512 Age-related nuclear cataract, left eye: Secondary | ICD-10-CM | POA: Diagnosis not present

## 2018-01-17 ENCOUNTER — Ambulatory Visit (INDEPENDENT_AMBULATORY_CARE_PROVIDER_SITE_OTHER)
Admission: RE | Admit: 2018-01-17 | Discharge: 2018-01-17 | Disposition: A | Discharge status: Home / Self Care | Payer: Medicare Other | Source: Ambulatory Visit | Attending: Pulmonary Disease | Admitting: Pulmonary Disease

## 2018-01-17 DIAGNOSIS — R911 Solitary pulmonary nodule: Secondary | ICD-10-CM | POA: Diagnosis not present

## 2018-01-17 MED ORDER — IOPAMIDOL (ISOVUE-300) INJECTION 61%
80.0000 mL | Freq: Once | INTRAVENOUS | Status: AC | PRN
  Administered 2018-01-17: 80 mL via INTRAVENOUS

## 2018-01-20 NOTE — Telephone Encounter (Signed)
Dr. Halford Chessman please see below email from patient and advise. Thank you!   ----- Message -----  From: [name]  Sent: 9/12/20196:02 PM EDT  To: Chesley Mires, MD Subject: Visit Follow-Up Question  I had an Ct scan on Tuesday and would like to know the results. Thank you

## 2018-01-23 ENCOUNTER — Other Ambulatory Visit: Payer: Self-pay | Admitting: Pulmonary Disease

## 2018-01-23 ENCOUNTER — Telehealth: Payer: Self-pay | Admitting: Pulmonary Disease

## 2018-01-23 DIAGNOSIS — R918 Other nonspecific abnormal finding of lung field: Secondary | ICD-10-CM

## 2018-01-23 NOTE — Telephone Encounter (Signed)
CT chest 01/17/18 >> mild cluster of subpleural nodules RUL, 5 mm nodule LUL   Left message explaining CT chest findings.  She will need f/u CT chest in 6 months.  Advised her to call back with questions.

## 2018-01-23 NOTE — Progress Notes (Signed)
CT chest 01/17/18 >> mild cluster of subpleural nodules RUL, 5 mm nodule LUL  Will schedule CT chest w/o contrast for March 2020.

## 2018-02-03 ENCOUNTER — Ambulatory Visit (INDEPENDENT_AMBULATORY_CARE_PROVIDER_SITE_OTHER): Payer: Medicare Other | Admitting: Psychology

## 2018-02-03 DIAGNOSIS — F4321 Adjustment disorder with depressed mood: Secondary | ICD-10-CM | POA: Diagnosis not present

## 2018-02-08 ENCOUNTER — Other Ambulatory Visit: Payer: Self-pay | Admitting: Pulmonary Disease

## 2018-02-08 DIAGNOSIS — R911 Solitary pulmonary nodule: Secondary | ICD-10-CM

## 2018-02-08 DIAGNOSIS — J449 Chronic obstructive pulmonary disease, unspecified: Secondary | ICD-10-CM

## 2018-02-09 DIAGNOSIS — H2511 Age-related nuclear cataract, right eye: Secondary | ICD-10-CM | POA: Diagnosis not present

## 2018-02-09 DIAGNOSIS — H25011 Cortical age-related cataract, right eye: Secondary | ICD-10-CM | POA: Diagnosis not present

## 2018-02-13 ENCOUNTER — Ambulatory Visit (INDEPENDENT_AMBULATORY_CARE_PROVIDER_SITE_OTHER): Payer: Medicare Other | Admitting: Pulmonary Disease

## 2018-02-13 ENCOUNTER — Encounter: Payer: Self-pay | Admitting: Pulmonary Disease

## 2018-02-13 VITALS — BP 126/76 | HR 73 | Ht 62.01 in | Wt 244.8 lb

## 2018-02-13 DIAGNOSIS — G4734 Idiopathic sleep related nonobstructive alveolar hypoventilation: Secondary | ICD-10-CM | POA: Diagnosis not present

## 2018-02-13 DIAGNOSIS — J449 Chronic obstructive pulmonary disease, unspecified: Secondary | ICD-10-CM

## 2018-02-13 DIAGNOSIS — Z23 Encounter for immunization: Secondary | ICD-10-CM | POA: Diagnosis not present

## 2018-02-13 DIAGNOSIS — R911 Solitary pulmonary nodule: Secondary | ICD-10-CM

## 2018-02-13 DIAGNOSIS — J4489 Other specified chronic obstructive pulmonary disease: Secondary | ICD-10-CM

## 2018-02-13 LAB — PULMONARY FUNCTION TEST
FEF 25-75 PRE: 0.86 L/s
FEF2575-%Pred-Pre: 45 %
FEV1-%PRED-PRE: 58 %
FEV1-Pre: 1.26 L
FEV1FVC-%PRED-PRE: 94 %
FEV6-%Pred-Pre: 64 %
FEV6-Pre: 1.76 L
FEV6FVC-%Pred-Pre: 104 %
FVC-%Pred-Pre: 62 %
FVC-PRE: 1.76 L
PRE FEV1/FVC RATIO: 72 %
PRE FEV6/FVC RATIO: 100 %

## 2018-02-13 NOTE — Patient Instructions (Signed)
High dose flu shot today  Will arrange for overnight oxygen test  Follow up in March 2020 after you have your CT chest

## 2018-02-13 NOTE — Progress Notes (Signed)
Spirometry completed per VS.

## 2018-02-13 NOTE — Progress Notes (Signed)
[facility]  Chief Complaint  Patient presents with  . Follow-up    [name] today, would like high dose flu shot today    Constitutional: BP 126/76 (BP Location: Left Arm, Cuff Size: Normal)   Pulse 73   Ht 5' 2.01" (1.575 m)   Wt 244 lb 12.8 oz (111 kg)   SpO2 95%   BMI 44.76 kg/m   History of Present Illness: [name] is a 67 y.o. female with nocturnal hypoxia and COPD with asthma.  She has been using arnuity.  She isn't sure if this has helped.  She is exercising more.  Not having cough, wheeze, chest pain, fever, skin rash, or leg swelling.  Still using oxygen at night.  Respiratory Exam:  General - alert Eyes - pupils reactive ENT - no sinus tenderness, no stridor Cardiac - regular rate/rhythm, no murmur Chest - equal breath sounds b/l, no wheezing or rales Abdomen - soft, non tender Extremities - no cyanosis, clubbing, or edema Skin - no rashes Lymphatics - no lymphadenopathy Neuro - CN intact, normal strength, moves extremities, follows commands Psych - normal mood and behavior   Assessment/Plan:  COPD with asthma. - continue arnuity for now - if her repeat ONO is better, then plan is to continue arnuity - if no difference with ONO, then will try her off arnuity  Nocturnal hypoxemia with obesity hypoventilation syndrome and COPD/asthma. - continue 2 liters O2 at night - repeat ONO with room air  Obesity. - encouraged her to keep up with her exercise and weight loss regimen   Patient Instructions  High dose flu shot today  Will arrange for overnight oxygen test  Follow up in March 2020 after you have your CT chest    Monica Mires, MD [place] 02/13/2018, 11:44 AM  Flow Sheet  Pulmonary tests: PFT 12/23/17 >> FEV1 1.60 (69%), FEV1% 65, TLC 4.61 (92%), DLCO 104%, +BD CT chest 01/17/18 >> mild cluster of subpleural nodules RUL, 5 mm nodule LUL Spirometry 02/13/18 >> FEV1 1.26  (58%), FEV1% 72  Sleep tests: HST 11/06/16 >> AHI 2.8, SpO2 low 85% ONO with RA 11/08/16 >> Average SpO2 88%, low SpO2 73%, Spent 5 hrs 7 min with SpO2 < 88% PSG 02/17/17 >> AHI 2.2, SpO2 low 80% ONO with RA 12/01/17 >> test time 7 hrs 54 min.  Baseline SpO2 90%, low SpO2 82%.  Spent 2 hrs 4 min with SpO2 < 88%.  Cardiac tests: Echo bubble study 08/17/17 >> EF 55 to 60%, mod LVH, grade 1 DD, no shunt  Past Medical History: She  has a past medical history of Abnormal EKG (10/06/2016), Abnormal nuclear stress test (10/28/2016), Asthma, Atypical chest pain, Diabetes mellitus without complication ([place]), DJD (degenerative joint disease), DM (diabetes mellitus) ([place]) (10/28/2016), Family history of early CAD (10/06/2016), GERD (gastroesophageal reflux disease), H/O squamous cell carcinoma, Hyperlipidemia, Hypothyroidism, Morbid obesity ([place]), Nocturnal hypoxemia (08/10/2017), Osteopenia, Severe stage glaucoma, Snoring (10/06/2016), Thyroid disease, and Thyroid nodule.  Past Surgical History: She  has a past surgical history that includes Cholecystectomy; Tonsillectomy; Tubal ligation; THYROID NODULE REMOVAL; S/P COLONOSCOPY (06/2011); Breast reduction surgery; Knee arthroscopy (Left); Wrist surgery (Left); LEFT HEART CATH AND CORONARY ANGIOGRAPHY (N/A, 11/05/2016); Reduction mammaplasty (Bilateral); and TEE without cardioversion (N/A, 08/23/2017).  Family History: Her family history includes Heart disease in her father.  Social History: She  reports that she has never smoked. She has never used smokeless tobacco. She reports that she drinks alcohol. She  reports that she does not use drugs.  Medications: Allergies as of 02/13/2018      Reactions   Ciprofloxacin Other (See Comments)   Dizziness/vertigo   Sulfa Antibiotics Swelling   Sulfasalazine Swelling      Medication List        Accurate as of 02/13/18 11:44 AM. Always use your most recent med list.          brimonidine 0.2 % ophthalmic  solution Commonly known as:  ALPHAGAN Place 1 drop into both eyes 2 (two) times daily.   carboxymethylcellulose 0.5 % Soln Commonly known as:  REFRESH PLUS Place 1 drop into both eyes 3 (three) times daily as needed (for dry eyes).   cetirizine 10 MG tablet Commonly known as:  ZYRTEC Take 10 mg by mouth at bedtime.   diclofenac sodium 1 % Gel Commonly known as:  VOLTAREN Apply 1-2 g topically 3 (three) times daily as needed (for knee pain.).   esomeprazole 20 MG capsule Commonly known as:  NEXIUM Take 20 mg by mouth daily as needed (for acid reflux/indigestion).   FISH OIL ULTRA PO Take 1,200 mg by mouth every Tuesday, Thursday, Saturday, and Sunday at 6 PM.   Fluticasone Furoate 100 MCG/ACT Aepb Inhale 1 puff into the lungs daily.   glimepiride 4 MG tablet Commonly known as:  AMARYL Take 4 mg by mouth 2 (two) times daily.   ibuprofen 200 MG tablet Commonly known as:  ADVIL,MOTRIN Take 400-800 mg by mouth every 8 (eight) hours as needed for headache or moderate pain.   JARDIANCE 10 MG Tabs tablet Generic drug:  empagliflozin Take 10 mg by mouth daily.   latanoprost 0.005 % ophthalmic solution Commonly known as:  XALATAN Place 1 drop into both eyes at bedtime.   levothyroxine 150 MCG tablet Commonly known as:  SYNTHROID, LEVOTHROID Take 150 mcg by mouth daily before breakfast.   metFORMIN 500 MG tablet Commonly known as:  GLUCOPHAGE Take 1,000 mg by mouth 2 (two) times daily with a meal.   methocarbamol 500 MG tablet Commonly known as:  ROBAXIN Take 1 tablet (500 mg total) by mouth 2 (two) times daily.   multivitamin with minerals Tabs tablet Take 1 tablet by mouth at bedtime. CENTRUM   pregabalin 50 MG capsule Commonly known as:  LYRICA Take 50 mg by mouth every Monday, Wednesday, and Friday. In the morning   simvastatin 40 MG tablet Commonly known as:  ZOCOR Take 40 mg by mouth every Monday, Wednesday, and Friday at 8 PM.   Vitamin D-3 5000 units  Tabs Take 5,000 Units by mouth every Monday, Wednesday, and Friday.

## 2018-02-23 ENCOUNTER — Encounter: Payer: Self-pay | Admitting: Pulmonary Disease

## 2018-02-23 DIAGNOSIS — J449 Chronic obstructive pulmonary disease, unspecified: Secondary | ICD-10-CM | POA: Diagnosis not present

## 2018-02-23 DIAGNOSIS — R0902 Hypoxemia: Secondary | ICD-10-CM | POA: Diagnosis not present

## 2018-02-28 DIAGNOSIS — H2511 Age-related nuclear cataract, right eye: Secondary | ICD-10-CM | POA: Diagnosis not present

## 2018-02-28 DIAGNOSIS — H25811 Combined forms of age-related cataract, right eye: Secondary | ICD-10-CM | POA: Diagnosis not present

## 2018-03-02 NOTE — Telephone Encounter (Signed)
Dr. Halford Chessman,  Please see patient's email that was sent today, 03/02/18 in regards to wanting to know the results of the ONO.   Thanks!

## 2018-03-03 ENCOUNTER — Telehealth: Payer: Self-pay | Admitting: Pulmonary Disease

## 2018-03-03 NOTE — Telephone Encounter (Signed)
ONO with RA 02/23/18 >> test time 8 hrs 1 min.  Baseline SpO2 90%, low SpO2 79%.  Spent 2 hrs 1 min with SpO2 < 88%.

## 2018-03-03 NOTE — Telephone Encounter (Signed)
ONO with RA 02/23/18 >> test time 8 hrs 1 min.  Baseline SpO2 90%, low SpO2 79%.  Spent 2 hrs 1 min with SpO2 < 88%.   Please let her know her oxygen level is still low.  She should continue using supplemental oxygen at night.  She can also try stopping arnuity.

## 2018-03-07 NOTE — Telephone Encounter (Signed)
VS, please advise if we need to set up pt on O2 prior to contacting patient. Thank you.

## 2018-03-08 NOTE — Telephone Encounter (Signed)
Please see email note from 03/02/18.  Pt already instructed on what to do.

## 2018-03-24 ENCOUNTER — Ambulatory Visit (INDEPENDENT_AMBULATORY_CARE_PROVIDER_SITE_OTHER): Payer: Medicare Other | Admitting: Psychology

## 2018-03-24 DIAGNOSIS — F4321 Adjustment disorder with depressed mood: Secondary | ICD-10-CM | POA: Diagnosis not present

## 2018-03-27 DIAGNOSIS — Z7984 Long term (current) use of oral hypoglycemic drugs: Secondary | ICD-10-CM | POA: Diagnosis not present

## 2018-03-27 DIAGNOSIS — E7800 Pure hypercholesterolemia, unspecified: Secondary | ICD-10-CM | POA: Diagnosis not present

## 2018-03-27 DIAGNOSIS — E119 Type 2 diabetes mellitus without complications: Secondary | ICD-10-CM | POA: Diagnosis not present

## 2018-03-27 DIAGNOSIS — E039 Hypothyroidism, unspecified: Secondary | ICD-10-CM | POA: Diagnosis not present

## 2018-04-04 ENCOUNTER — Other Ambulatory Visit: Payer: Self-pay | Admitting: Family Medicine

## 2018-04-04 DIAGNOSIS — Z1231 Encounter for screening mammogram for malignant neoplasm of breast: Secondary | ICD-10-CM

## 2018-04-10 ENCOUNTER — Ambulatory Visit
Admission: RE | Admit: 2018-04-10 | Discharge: 2018-04-10 | Disposition: A | Discharge status: Home / Self Care | Payer: Medicare Other | Source: Ambulatory Visit

## 2018-04-10 DIAGNOSIS — Z1231 Encounter for screening mammogram for malignant neoplasm of breast: Secondary | ICD-10-CM | POA: Diagnosis not present

## 2018-04-20 DIAGNOSIS — K649 Unspecified hemorrhoids: Secondary | ICD-10-CM | POA: Diagnosis not present

## 2018-04-20 DIAGNOSIS — Z86010 Personal history of colonic polyps: Secondary | ICD-10-CM | POA: Diagnosis not present

## 2018-04-20 DIAGNOSIS — D126 Benign neoplasm of colon, unspecified: Secondary | ICD-10-CM | POA: Diagnosis not present

## 2018-04-25 DIAGNOSIS — D126 Benign neoplasm of colon, unspecified: Secondary | ICD-10-CM | POA: Diagnosis not present

## 2018-05-12 DIAGNOSIS — H401233 Low-tension glaucoma, bilateral, severe stage: Secondary | ICD-10-CM | POA: Diagnosis not present

## 2018-05-29 DIAGNOSIS — E039 Hypothyroidism, unspecified: Secondary | ICD-10-CM | POA: Diagnosis not present

## 2018-07-13 DIAGNOSIS — H401233 Low-tension glaucoma, bilateral, severe stage: Secondary | ICD-10-CM | POA: Diagnosis not present

## 2018-07-13 DIAGNOSIS — H1851 Endothelial corneal dystrophy: Secondary | ICD-10-CM | POA: Diagnosis not present

## 2018-07-13 DIAGNOSIS — H5340 Unspecified visual field defects: Secondary | ICD-10-CM | POA: Diagnosis not present

## 2018-07-17 NOTE — Telephone Encounter (Signed)
Dr. Halford Chessman please advise on patients e-mail below. Thank you.

## 2018-07-25 NOTE — Telephone Encounter (Signed)
Dr. Halford Chessman please advise on patients e-mail below. Thank you.

## 2018-07-27 ENCOUNTER — Inpatient Hospital Stay: Admission: RE | Admit: 2018-07-27 | Payer: Self-pay | Source: Ambulatory Visit

## 2018-08-14 ENCOUNTER — Ambulatory Visit: Payer: Self-pay | Admitting: Pulmonary Disease

## 2018-09-01 ENCOUNTER — Ambulatory Visit: Payer: Medicare Other | Admitting: Cardiology

## 2018-09-20 ENCOUNTER — Other Ambulatory Visit: Payer: Medicare Other

## 2018-09-21 ENCOUNTER — Telehealth: Payer: Self-pay | Admitting: *Deleted

## 2018-09-21 NOTE — Telephone Encounter (Signed)

## 2018-09-22 ENCOUNTER — Ambulatory Visit (INDEPENDENT_AMBULATORY_CARE_PROVIDER_SITE_OTHER)
Admission: RE | Admit: 2018-09-22 | Discharge: 2018-09-22 | Disposition: A | Discharge status: Home / Self Care | Payer: Medicare Other | Source: Ambulatory Visit | Attending: Pulmonary Disease | Admitting: Pulmonary Disease

## 2018-09-22 ENCOUNTER — Other Ambulatory Visit: Payer: Self-pay

## 2018-09-22 DIAGNOSIS — R918 Other nonspecific abnormal finding of lung field: Secondary | ICD-10-CM | POA: Diagnosis not present

## 2018-09-28 ENCOUNTER — Telehealth: Payer: Self-pay | Admitting: Pulmonary Disease

## 2018-09-28 DIAGNOSIS — R918 Other nonspecific abnormal finding of lung field: Secondary | ICD-10-CM

## 2018-09-28 NOTE — Telephone Encounter (Signed)
CT chest 09/22/18 >> decreased size of nodular cluster RUL, new cluster of nodules in LUL, stable 5 mm LUL nodule   Please let her know that the main nodule in Lt upper lung is stable, and the smaller nodules in Rt upper lung have decreased in size.  She has a very, very small area of nodules in the Lt upper lung which is new compared to CT chest from September 2019.  This likely represents an area of irritation similar to what was causing nodules in her Rt lung in September.  No indication for medication or biopsy at this time.  She will need a follow up CT chest w/o contrast in November 2020.

## 2018-10-03 DIAGNOSIS — H35033 Hypertensive retinopathy, bilateral: Secondary | ICD-10-CM | POA: Diagnosis not present

## 2018-10-03 DIAGNOSIS — E113291 Type 2 diabetes mellitus with mild nonproliferative diabetic retinopathy without macular edema, right eye: Secondary | ICD-10-CM | POA: Diagnosis not present

## 2018-10-03 DIAGNOSIS — H47323 Drusen of optic disc, bilateral: Secondary | ICD-10-CM | POA: Diagnosis not present

## 2018-10-03 DIAGNOSIS — H401231 Low-tension glaucoma, bilateral, mild stage: Secondary | ICD-10-CM | POA: Diagnosis not present

## 2018-10-09 NOTE — Telephone Encounter (Signed)
Called and spoke with patient regarding results.  Informed the patient of results and recommendations today. Placed order for CT for 02/27/19 to [facility] Pt verbalized understanding and denied any questions or concerns at this time.  Nothing further needed.

## 2018-10-13 DIAGNOSIS — M1711 Unilateral primary osteoarthritis, right knee: Secondary | ICD-10-CM | POA: Diagnosis not present

## 2018-10-17 DIAGNOSIS — Z6841 Body Mass Index (BMI) 40.0 and over, adult: Secondary | ICD-10-CM | POA: Diagnosis not present

## 2018-10-17 DIAGNOSIS — E039 Hypothyroidism, unspecified: Secondary | ICD-10-CM | POA: Diagnosis not present

## 2018-10-17 DIAGNOSIS — M6281 Muscle weakness (generalized): Secondary | ICD-10-CM | POA: Diagnosis not present

## 2018-10-17 DIAGNOSIS — E7800 Pure hypercholesterolemia, unspecified: Secondary | ICD-10-CM | POA: Diagnosis not present

## 2018-10-17 DIAGNOSIS — R2689 Other abnormalities of gait and mobility: Secondary | ICD-10-CM | POA: Diagnosis not present

## 2018-10-17 DIAGNOSIS — E119 Type 2 diabetes mellitus without complications: Secondary | ICD-10-CM | POA: Diagnosis not present

## 2018-10-17 DIAGNOSIS — Z7984 Long term (current) use of oral hypoglycemic drugs: Secondary | ICD-10-CM | POA: Diagnosis not present

## 2018-10-17 DIAGNOSIS — M1711 Unilateral primary osteoarthritis, right knee: Secondary | ICD-10-CM | POA: Diagnosis not present

## 2018-10-17 DIAGNOSIS — Z0000 Encounter for general adult medical examination without abnormal findings: Secondary | ICD-10-CM | POA: Diagnosis not present

## 2018-10-19 DIAGNOSIS — R2689 Other abnormalities of gait and mobility: Secondary | ICD-10-CM | POA: Diagnosis not present

## 2018-10-19 DIAGNOSIS — M6281 Muscle weakness (generalized): Secondary | ICD-10-CM | POA: Diagnosis not present

## 2018-10-19 DIAGNOSIS — M1711 Unilateral primary osteoarthritis, right knee: Secondary | ICD-10-CM | POA: Diagnosis not present

## 2018-10-24 DIAGNOSIS — M6281 Muscle weakness (generalized): Secondary | ICD-10-CM | POA: Diagnosis not present

## 2018-10-24 DIAGNOSIS — R2689 Other abnormalities of gait and mobility: Secondary | ICD-10-CM | POA: Diagnosis not present

## 2018-10-24 DIAGNOSIS — M1711 Unilateral primary osteoarthritis, right knee: Secondary | ICD-10-CM | POA: Diagnosis not present

## 2018-10-26 DIAGNOSIS — R2689 Other abnormalities of gait and mobility: Secondary | ICD-10-CM | POA: Diagnosis not present

## 2018-10-26 DIAGNOSIS — M6281 Muscle weakness (generalized): Secondary | ICD-10-CM | POA: Diagnosis not present

## 2018-10-26 DIAGNOSIS — M1711 Unilateral primary osteoarthritis, right knee: Secondary | ICD-10-CM | POA: Diagnosis not present

## 2018-10-31 DIAGNOSIS — M1711 Unilateral primary osteoarthritis, right knee: Secondary | ICD-10-CM | POA: Diagnosis not present

## 2018-10-31 DIAGNOSIS — R2689 Other abnormalities of gait and mobility: Secondary | ICD-10-CM | POA: Diagnosis not present

## 2018-10-31 DIAGNOSIS — M6281 Muscle weakness (generalized): Secondary | ICD-10-CM | POA: Diagnosis not present

## 2018-11-02 DIAGNOSIS — M6281 Muscle weakness (generalized): Secondary | ICD-10-CM | POA: Diagnosis not present

## 2018-11-02 DIAGNOSIS — M1711 Unilateral primary osteoarthritis, right knee: Secondary | ICD-10-CM | POA: Diagnosis not present

## 2018-11-02 DIAGNOSIS — R2689 Other abnormalities of gait and mobility: Secondary | ICD-10-CM | POA: Diagnosis not present

## 2018-11-07 DIAGNOSIS — R2689 Other abnormalities of gait and mobility: Secondary | ICD-10-CM | POA: Diagnosis not present

## 2018-11-07 DIAGNOSIS — M6281 Muscle weakness (generalized): Secondary | ICD-10-CM | POA: Diagnosis not present

## 2018-11-07 DIAGNOSIS — M1711 Unilateral primary osteoarthritis, right knee: Secondary | ICD-10-CM | POA: Diagnosis not present

## 2018-11-09 DIAGNOSIS — M1711 Unilateral primary osteoarthritis, right knee: Secondary | ICD-10-CM | POA: Diagnosis not present

## 2018-11-09 DIAGNOSIS — M6281 Muscle weakness (generalized): Secondary | ICD-10-CM | POA: Diagnosis not present

## 2018-11-09 DIAGNOSIS — R2689 Other abnormalities of gait and mobility: Secondary | ICD-10-CM | POA: Diagnosis not present

## 2018-11-14 DIAGNOSIS — R2689 Other abnormalities of gait and mobility: Secondary | ICD-10-CM | POA: Diagnosis not present

## 2018-11-14 DIAGNOSIS — M1711 Unilateral primary osteoarthritis, right knee: Secondary | ICD-10-CM | POA: Diagnosis not present

## 2018-11-14 DIAGNOSIS — M6281 Muscle weakness (generalized): Secondary | ICD-10-CM | POA: Diagnosis not present

## 2018-11-16 DIAGNOSIS — M6281 Muscle weakness (generalized): Secondary | ICD-10-CM | POA: Diagnosis not present

## 2018-11-16 DIAGNOSIS — R2689 Other abnormalities of gait and mobility: Secondary | ICD-10-CM | POA: Diagnosis not present

## 2018-11-16 DIAGNOSIS — M1711 Unilateral primary osteoarthritis, right knee: Secondary | ICD-10-CM | POA: Diagnosis not present

## 2018-11-21 DIAGNOSIS — M1711 Unilateral primary osteoarthritis, right knee: Secondary | ICD-10-CM | POA: Diagnosis not present

## 2018-11-21 DIAGNOSIS — R2689 Other abnormalities of gait and mobility: Secondary | ICD-10-CM | POA: Diagnosis not present

## 2018-11-21 DIAGNOSIS — M6281 Muscle weakness (generalized): Secondary | ICD-10-CM | POA: Diagnosis not present

## 2018-11-23 DIAGNOSIS — M1711 Unilateral primary osteoarthritis, right knee: Secondary | ICD-10-CM | POA: Diagnosis not present

## 2018-11-23 DIAGNOSIS — R2689 Other abnormalities of gait and mobility: Secondary | ICD-10-CM | POA: Diagnosis not present

## 2018-11-23 DIAGNOSIS — M6281 Muscle weakness (generalized): Secondary | ICD-10-CM | POA: Diagnosis not present

## 2018-11-28 DIAGNOSIS — D2272 Melanocytic nevi of left lower limb, including hip: Secondary | ICD-10-CM | POA: Diagnosis not present

## 2018-11-28 DIAGNOSIS — D2262 Melanocytic nevi of left upper limb, including shoulder: Secondary | ICD-10-CM | POA: Diagnosis not present

## 2018-11-28 DIAGNOSIS — M1711 Unilateral primary osteoarthritis, right knee: Secondary | ICD-10-CM | POA: Diagnosis not present

## 2018-11-28 DIAGNOSIS — D2271 Melanocytic nevi of right lower limb, including hip: Secondary | ICD-10-CM | POA: Diagnosis not present

## 2018-11-28 DIAGNOSIS — D2261 Melanocytic nevi of right upper limb, including shoulder: Secondary | ICD-10-CM | POA: Diagnosis not present

## 2018-11-28 DIAGNOSIS — D2239 Melanocytic nevi of other parts of face: Secondary | ICD-10-CM | POA: Diagnosis not present

## 2018-11-28 DIAGNOSIS — D225 Melanocytic nevi of trunk: Secondary | ICD-10-CM | POA: Diagnosis not present

## 2018-11-28 DIAGNOSIS — R2689 Other abnormalities of gait and mobility: Secondary | ICD-10-CM | POA: Diagnosis not present

## 2018-11-28 DIAGNOSIS — Z85828 Personal history of other malignant neoplasm of skin: Secondary | ICD-10-CM | POA: Diagnosis not present

## 2018-11-28 DIAGNOSIS — D1801 Hemangioma of skin and subcutaneous tissue: Secondary | ICD-10-CM | POA: Diagnosis not present

## 2018-11-28 DIAGNOSIS — L821 Other seborrheic keratosis: Secondary | ICD-10-CM | POA: Diagnosis not present

## 2018-11-28 DIAGNOSIS — M6281 Muscle weakness (generalized): Secondary | ICD-10-CM | POA: Diagnosis not present

## 2018-11-28 DIAGNOSIS — L918 Other hypertrophic disorders of the skin: Secondary | ICD-10-CM | POA: Diagnosis not present

## 2018-11-30 DIAGNOSIS — M6281 Muscle weakness (generalized): Secondary | ICD-10-CM | POA: Diagnosis not present

## 2018-11-30 DIAGNOSIS — M1711 Unilateral primary osteoarthritis, right knee: Secondary | ICD-10-CM | POA: Diagnosis not present

## 2018-11-30 DIAGNOSIS — R2689 Other abnormalities of gait and mobility: Secondary | ICD-10-CM | POA: Diagnosis not present

## 2018-12-05 DIAGNOSIS — M1711 Unilateral primary osteoarthritis, right knee: Secondary | ICD-10-CM | POA: Diagnosis not present

## 2018-12-05 DIAGNOSIS — R2689 Other abnormalities of gait and mobility: Secondary | ICD-10-CM | POA: Diagnosis not present

## 2018-12-05 DIAGNOSIS — M6281 Muscle weakness (generalized): Secondary | ICD-10-CM | POA: Diagnosis not present

## 2018-12-07 DIAGNOSIS — M6281 Muscle weakness (generalized): Secondary | ICD-10-CM | POA: Diagnosis not present

## 2018-12-07 DIAGNOSIS — M1711 Unilateral primary osteoarthritis, right knee: Secondary | ICD-10-CM | POA: Diagnosis not present

## 2018-12-07 DIAGNOSIS — R2689 Other abnormalities of gait and mobility: Secondary | ICD-10-CM | POA: Diagnosis not present

## 2018-12-28 ENCOUNTER — Ambulatory Visit: Payer: Medicare Other | Admitting: Cardiology

## 2019-01-05 DIAGNOSIS — E039 Hypothyroidism, unspecified: Secondary | ICD-10-CM | POA: Diagnosis not present

## 2019-01-22 NOTE — Progress Notes (Signed)
Cardiology Office Note    Date:  01/23/2019   ID:  [name], DOB 1950/12/06, MRN [id number]  PCP:  Aretta Nip, MD  Cardiologist: [name], MD EPS: None  Chief Complaint  Patient presents with  . Follow-up    History of Present Illness:  [name] is a 68 y.o. female with a history of HLD, DM type 2, family history of CAD, morbid obesity. Abnormal EKG with IRBBB, no ischemia on NST, cath 11/05/16 normal coronary arteries and normal LVEF. Sleep study no sleep apnea but nocturnal hypoxia O2 80% placed on O2. TEE no PFO  Patient here for yearly f/u. Using O2 at night. No chest pain, palpitations, dyspnea, dyspnea on exertion, edema or presyncope. Walking about a half hour daily with 2 yr old granddaughter.       Past Medical History:  Diagnosis Date  . Abnormal EKG 10/06/2016  . Abnormal nuclear stress test 10/28/2016  . Asthma   . Atypical chest pain   . Diabetes mellitus without complication ([place])   . DJD (degenerative joint disease)   . DM (diabetes mellitus) ([place]) 10/28/2016  . Family history of early CAD 10/06/2016  . GERD (gastroesophageal reflux disease)   . H/O squamous cell carcinoma    NOSE DR. [name]  . Hyperlipidemia   . Hypothyroidism   . Morbid obesity ([place])   . Nocturnal hypoxemia 08/10/2017  . Osteopenia   . Severe stage glaucoma    DR. [name] S/P LASER Rx  . Snoring 10/06/2016  . Thyroid disease   . Thyroid nodule    BENIGN    Past Surgical History:  Procedure Laterality Date  . BREAST REDUCTION SURGERY    . CHOLECYSTECTOMY    . KNEE ARTHROSCOPY Left   . LEFT HEART CATH AND CORONARY ANGIOGRAPHY N/A 11/05/2016   Procedure: Left Heart Cath and Coronary Angiography;  Surgeon: Belva Crome, MD;  Location: [facility];  Service: Cardiovascular;  Laterality: N/A;  . REDUCTION MAMMAPLASTY Bilateral   . S/P COLONOSCOPY  06/2011   HYPERPLASTIC POLYPS REPEAT 06/2016 DR. [name]  . TEE WITHOUT CARDIOVERSION N/A  08/23/2017   Procedure: TRANSESOPHAGEAL ECHOCARDIOGRAM (TEE);  Surgeon: Jerline Pain, MD;  Location: [facility];  Service: Cardiovascular;  Laterality: N/A;  . THYROID NODULE REMOVAL    . TONSILLECTOMY    . TUBAL LIGATION    . WRIST SURGERY Left    JOINT REPAIR    Current Medications: Current Meds  Medication Sig  . carboxymethylcellulose (REFRESH PLUS) 0.5 % SOLN Place 1 drop into both eyes 3 (three) times daily as needed (for dry eyes).  . cetirizine (ZYRTEC) 10 MG tablet Take 10 mg by mouth at bedtime.  . Cholecalciferol (VITAMIN D-3) 5000 units TABS Take 5,000 Units by mouth every Monday, Wednesday, and Friday.  . diclofenac sodium (VOLTAREN) 1 % GEL Apply 1-2 g topically 3 (three) times daily as needed (for knee pain.).  [name] empagliflozin (JARDIANCE) 10 MG TABS tablet Take 10 mg by mouth daily.  [name] esomeprazole (NEXIUM) 20 MG capsule Take 20 mg by mouth daily as needed (for acid reflux/indigestion).   [name] glimepiride (AMARYL) 4 MG tablet Take 4 mg by mouth 2 (two) times daily.   [name] ibuprofen (ADVIL,MOTRIN) 200 MG tablet Take 400-800 mg by mouth every 8 (eight) hours as needed for headache or moderate pain.   . metFORMIN (GLUCOPHAGE) 500 MG tablet Take 1,000 mg by mouth 2 (two) times daily with a meal.   .  Multiple Vitamin (MULTIVITAMIN WITH MINERALS) TABS Take 1 tablet by mouth at bedtime. CENTRUM  . Omega-3 Fatty Acids (FISH OIL ULTRA PO) Take 1,200 mg by mouth every Tuesday, Thursday, Saturday, and Sunday at 6 PM.   . simvastatin (ZOCOR) 40 MG tablet Take 40 mg by mouth every Monday, Wednesday, and Friday at 8 PM.  . SYNTHROID 125 MCG tablet      Allergies:   Ciprofloxacin, Sulfa antibiotics, and Sulfasalazine   Social History   Socioeconomic History  . Marital status: Married    Spouse name: Not on file  . Number of children: 3  . Years of education: COLLEGE  . Highest education level: Not on file  Occupational History  . Occupation: [employer]  Social Needs  .  Financial resource strain: Not on file  . Food insecurity    Worry: Not on file    Inability: Not on file  . Transportation needs    Medical: Not on file    Non-medical: Not on file  Tobacco Use  . Smoking status: Never Smoker  . Smokeless tobacco: Never Used  Substance and Sexual Activity  . Alcohol use: Yes  . Drug use: No  . Sexual activity: Not on file  Lifestyle  . Physical activity    Days per week: Not on file    Minutes per session: Not on file  . Stress: Not on file  Relationships  . Social Herbalist on phone: Not on file    Gets together: Not on file    Attends religious service: Not on file    Active member of club or organization: Not on file    Attends meetings of clubs or organizations: Not on file    Relationship status: Not on file  Other Topics Concern  . Not on file  Social History Narrative  . Not on file     Family History:  The patient's   family history includes Heart disease in her father.   ROS:   Please see the history of present illness.    ROS All other systems reviewed and are negative.   PHYSICAL EXAM:   VS:  BP 120/76   Ht 5\' 2"  (1.575 m)   Wt 236 lb 6.4 oz (107.2 kg)   SpO2 96%   BMI 43.24 kg/m   Physical Exam  GEN: Obese in no acute distress  Neck: no JVD, carotid bruits, or masses Cardiac:RRR; no murmurs, rubs, or gallops  Respiratory: Decreased breath sounds with some wheezing at right lung base GI: soft, nontender, nondistended, + BS Ext: without cyanosis, clubbing, or edema, Good distal pulses bilaterally Neuro:  Alert and Oriented x 3 Psych: euthymic mood, full affect  Wt Readings from Last 3 Encounters:  01/23/19 236 lb 6.4 oz (107.2 kg)  02/13/18 244 lb 12.8 oz (111 kg)  12/23/17 242 lb (109.8 kg)      Studies/Labs Reviewed:   EKG:  EKG is  ordered today.  The ekg ordered today demonstrates NSR with IRBBB  Recent Labs: No results found for requested labs within last 8760 hours.   Lipid Panel No  results found for: CHOL, TRIG, HDL, CHOLHDL, VLDL, LDLCALC, LDLDIRECT  Additional studies/ records that were reviewed today include:  TEE 4/16/19Study Conclusions   - Left ventricle: The cavity size was normal. Wall thickness was   normal. Systolic function was normal. The estimated ejection   fraction was in the range of 60% to 65%. - Aortic valve: No evidence  of vegetation. - Mitral valve: No evidence of vegetation. - Left atrium: No evidence of thrombus in the appendage. - Right atrium: No evidence of thrombus in the atrial cavity or   appendage. - Atrial septum: The septum bowed from left to right, consistent   with increased left atrial pressure. No defect or patent foramen   ovale was identified. Echo contrast study showed no right-to-left   atrial level shunt, following an increase in RA pressure induced   by provocative maneuvers. Echo contrast study showed no   right-to-left atrial level shunt, at baseline or with   provocation. - Tricuspid valve: No evidence of vegetation. - Pulmonic valve: No evidence of vegetation. - Superior vena cava: The study excluded a thrombus.   2Decho with bubble study 4/10/19Study Conclusions   - Procedure narrative: Transthoracic echocardiography. Image   quality was adequate. The study was technically difficult, as a   result of body habitus. Intravenous contrast (agitated saline)   was administered. - Left ventricle: The cavity size was normal. Wall thickness was   increased in a pattern of moderate LVH. Systolic function was   normal. The estimated ejection fraction was in the range of 55%   to 60%. Wall motion was normal; there were no regional wall   motion abnormalities. Doppler parameters are consistent with   abnormal left ventricular relaxation (grade 1 diastolic   dysfunction). The E/e&' ratio is between 8-15, suggesting   indeterminate LV filling pressure. - Left atrium: The atrium was normal in size. - Atrial septum: No large  atrial level shunt by microbubble   contrast - however, study was not sufficient to exclude an right   to left shunting. - Inferior vena cava: The vessel was normal in size. The   respirophasic diameter changes were in the normal range (>= 50%),   consistent with normal central venous pressure.   Impressions:   - Technically difficult study. Bubble study did not suggest large   immediate atrial level shunt, however, late microbubble shunting   could not be excluded. LVEF 55-60%, normal LA size, echogenic   thickening of the atrial septum - ?prior device or patch repair?   Consider TEE for better imaging.  NST 2018Study Conclusions   - Procedure narrative: Transthoracic echocardiography. Image   quality was adequate. The study was technically difficult, as a   result of body habitus. Intravenous contrast (agitated saline)   was administered. - Left ventricle: The cavity size was normal. Wall thickness was   increased in a pattern of moderate LVH. Systolic function was   normal. The estimated ejection fraction was in the range of 55%   to 60%. Wall motion was normal; there were no regional wall   motion abnormalities. Doppler parameters are consistent with   abnormal left ventricular relaxation (grade 1 diastolic   dysfunction). The E/e&' ratio is between 8-15, suggesting   indeterminate LV filling pressure. - Left atrium: The atrium was normal in size. - Atrial septum: No large atrial level shunt by microbubble   contrast - however, study was not sufficient to exclude an right   to left shunting. - Inferior vena cava: The vessel was normal in size. The   respirophasic diameter changes were in the normal range (>= 50%),   consistent with normal central venous pressure.   Impressions:   - Technically difficult study. Bubble study did not suggest large   immediate atrial level shunt, however, late microbubble shunting   could not be excluded. LVEF 55-60%, normal  LA size,  echogenic   thickening of the atrial septum - ?prior device or patch repair?   Consider TEE for better imaging. Study Highlights     Nuclear stress EF: 69%.  The left ventricular ejection fraction is hyperdynamic (>65%).  Upsloping ST segment depression ST segment depression of 1 mm was noted during stress in the II, III and aVF leads.  Findings consistent with prior myocardial infarction.  This is a low risk study.   Small inferior wall infarction from apex to base 1 mm ST depression upsloping inferior leads No ischemia  EF 69% no RWMA;s       Cardiac cath 2018Study Conclusions   - Procedure narrative: Transthoracic echocardiography. Image   quality was adequate. The study was technically difficult, as a   result of body habitus. Intravenous contrast (agitated saline)   was administered. - Left ventricle: The cavity size was normal. Wall thickness was   increased in a pattern of moderate LVH. Systolic function was   normal. The estimated ejection fraction was in the range of 55%   to 60%. Wall motion was normal; there were no regional wall   motion abnormalities. Doppler parameters are consistent with   abnormal left ventricular relaxation (grade 1 diastolic   dysfunction). The E/e&' ratio is between 8-15, suggesting   indeterminate LV filling pressure. - Left atrium: The atrium was normal in size. - Atrial septum: No large atrial level shunt by microbubble   contrast - however, study was not sufficient to exclude an right   to left shunting. - Inferior vena cava: The vessel was normal in size. The   respirophasic diameter changes were in the normal range (>= 50%),   consistent with normal central venous pressure.   Impressions:   - Technically difficult study. Bubble study did not suggest large   immediate atrial level shunt, however, late microbubble shunting   could not be excluded. LVEF 55-60%, normal LA size, echogenic   thickening of the atrial septum -  ?prior device or patch repair?   Consider TEE for better imaging.  The left ventricular ejection fraction is greater than 65% by visual estimate.  LV end diastolic pressure is normal.    Normal coronary arteries.  Normal left ventricular function.  False positive myocardial perfusion study   RECOMMENDATIONS:    No further cardiac evaluation for ischemia and symptoms are not related to myocardial ischemia.     ASSESSMENT:    1. Abnormal EKG   2. Nocturnal hypoxia   3. Morbid obesity ([place])   4. Mixed hyperlipidemia      PLAN:  In order of problems listed above:  Nocturnal Hypoxia-O2 sats dropped to 80% at night and O2 recommended. No OSA on sleep study-now followed by Dr. Halford Chessman  Obesity exercise and weight loss program recommended.  She has lost about 75 pounds over several years.  History of abnormal EKG but normal coronary arteries at cardiac cath.  Follow-up as needed with Dr. Radford Pax.  PCP managing diabetes and hyperlipidemia  Hyperlipidemia on Zocor LDL 70-10/17/18  Medication Adjustments/Labs and Tests Ordered: Current medicines are reviewed at length with the patient today.  Concerns regarding medicines are outlined above.  Medication changes, Labs and Tests ordered today are listed in the Patient Instructions below. Patient Instructions  Medication Instructions:  Your physician recommends that you continue on your current medications as directed. Please refer to the Current Medication list given to you today.  If you need a refill on your cardiac medications  before your next appointment, please call your pharmacy.   Lab work: None Ordered If you have labs (blood work) drawn today and your tests are completely normal, you will receive your results only by: [name] MyChart Message (if you have MyChart) OR . A paper copy in the mail If you have any lab test that is abnormal or we need to change your treatment, we will call you to review the results.   Testing/Procedures: None Ordered  Follow-Up: As needed with Dr. Radford Pax   Any Other Special Instructions Will Be Listed Below (If Applicable). Your provider recommends that you maintain 150 minutes per week of moderate aerobic activity.      Sumner Boast, PA-C  01/23/2019 12:56 PM    [facility] [address], [place], [place]  [address] Phone: [phone]; Fax: [phone]

## 2019-01-23 ENCOUNTER — Encounter: Payer: Self-pay | Admitting: Physician Assistant

## 2019-01-23 ENCOUNTER — Ambulatory Visit (INDEPENDENT_AMBULATORY_CARE_PROVIDER_SITE_OTHER): Payer: Medicare Other | Admitting: Physician Assistant

## 2019-01-23 ENCOUNTER — Other Ambulatory Visit: Payer: Self-pay

## 2019-01-23 VITALS — BP 120/76 | Ht 62.0 in | Wt 236.4 lb

## 2019-01-23 DIAGNOSIS — R9431 Abnormal electrocardiogram [ECG] [EKG]: Secondary | ICD-10-CM

## 2019-01-23 DIAGNOSIS — G4734 Idiopathic sleep related nonobstructive alveolar hypoventilation: Secondary | ICD-10-CM

## 2019-01-23 DIAGNOSIS — E782 Mixed hyperlipidemia: Secondary | ICD-10-CM | POA: Diagnosis not present

## 2019-01-23 NOTE — Patient Instructions (Signed)
Medication Instructions:  Your physician recommends that you continue on your current medications as directed. Please refer to the Current Medication list given to you today.  If you need a refill on your cardiac medications before your next appointment, please call your pharmacy.   Lab work: None Ordered If you have labs (blood work) drawn today and your tests are completely normal, you will receive your results only by: [name] MyChart Message (if you have MyChart) OR . A paper copy in the mail If you have any lab test that is abnormal or we need to change your treatment, we will call you to review the results.  Testing/Procedures: None Ordered  Follow-Up: As needed with Dr. Radford Pax   Any Other Special Instructions Will Be Listed Below (If Applicable). Your provider recommends that you maintain 150 minutes per week of moderate aerobic activity.

## 2019-01-25 ENCOUNTER — Encounter: Payer: Self-pay | Admitting: Neurology

## 2019-01-25 ENCOUNTER — Other Ambulatory Visit: Payer: Self-pay

## 2019-01-25 DIAGNOSIS — R29898 Other symptoms and signs involving the musculoskeletal system: Secondary | ICD-10-CM | POA: Diagnosis not present

## 2019-01-25 DIAGNOSIS — M180 Bilateral primary osteoarthritis of first carpometacarpal joints: Secondary | ICD-10-CM | POA: Diagnosis not present

## 2019-01-25 DIAGNOSIS — M79642 Pain in left hand: Secondary | ICD-10-CM | POA: Diagnosis not present

## 2019-01-25 DIAGNOSIS — M79641 Pain in right hand: Secondary | ICD-10-CM | POA: Diagnosis not present

## 2019-01-25 DIAGNOSIS — R202 Paresthesia of skin: Secondary | ICD-10-CM

## 2019-01-25 DIAGNOSIS — Z9889 Other specified postprocedural states: Secondary | ICD-10-CM | POA: Diagnosis not present

## 2019-01-29 ENCOUNTER — Telehealth: Payer: Self-pay

## 2019-01-29 NOTE — Telephone Encounter (Signed)
Pt calling inquiring if VS would like for her to have a PFT before and/or on the day of her return office visit. Pt does not currently have a f/u appt scheduled w/ VS. According to last telephone notes, VS wanted pt to have a repeat CT Scan, which is scheduled for 03/12/2019; however, there was no mention of PFT.   VS, please advise if you would like pt to have a PFT before she schedules her next appt with you. Thank you.

## 2019-01-31 NOTE — Telephone Encounter (Signed)
LMTCB

## 2019-01-31 NOTE — Telephone Encounter (Signed)
Pt did call bk and I let her know that she did not need pft and she was fine.[name]

## 2019-01-31 NOTE — Telephone Encounter (Signed)
She does not need a PFT at this time, unless she has new or worsening respiratory symptoms.  She only needs to have CT chest done on 03/12/19 and ROV after this.

## 2019-02-02 DIAGNOSIS — E039 Hypothyroidism, unspecified: Secondary | ICD-10-CM | POA: Diagnosis not present

## 2019-02-02 DIAGNOSIS — Z7984 Long term (current) use of oral hypoglycemic drugs: Secondary | ICD-10-CM | POA: Diagnosis not present

## 2019-02-02 DIAGNOSIS — E7800 Pure hypercholesterolemia, unspecified: Secondary | ICD-10-CM | POA: Diagnosis not present

## 2019-02-02 DIAGNOSIS — E119 Type 2 diabetes mellitus without complications: Secondary | ICD-10-CM | POA: Diagnosis not present

## 2019-02-02 DIAGNOSIS — H35033 Hypertensive retinopathy, bilateral: Secondary | ICD-10-CM | POA: Diagnosis not present

## 2019-02-13 ENCOUNTER — Encounter

## 2019-02-13 ENCOUNTER — Other Ambulatory Visit: Payer: Self-pay

## 2019-02-13 ENCOUNTER — Ambulatory Visit (INDEPENDENT_AMBULATORY_CARE_PROVIDER_SITE_OTHER): Payer: Medicare Other | Admitting: Neurology

## 2019-02-13 DIAGNOSIS — R202 Paresthesia of skin: Secondary | ICD-10-CM | POA: Diagnosis not present

## 2019-02-13 DIAGNOSIS — G5603 Carpal tunnel syndrome, bilateral upper limbs: Secondary | ICD-10-CM

## 2019-02-13 NOTE — Procedures (Signed)
[facility]  [address]  [place], [place] [address] Tel: [phone] Fax:  [phone] Test Date:  02/13/2019  Patient: [name] DOB: 1951/04/26 Physician: Narda Amber, DO  Sex: Female Height: 5\' 2"  Ref Phys: Charlotte Crumb, MD  ID#: [id number] Temp: 34.0C Technician:    Patient Complaints: This is a 68 year old female referred for evaluation of bilateral hand pain and tingling.  NCV & EMG Findings: Extensive electrodiagnostic testing of the right upper extremity and additional studies of the left shows:  1. Bilateral median sensory responses show prolonged latency (R6.3, L5.5 ms) and reduced amplitude (R4.3, L5.7 V).  Bilateral ulnar sensory responses are within normal limits. 2. Bilateral median motor responses show prolonged latency (R6.9, L6.2 ms) and reduced amplitude (R4.5, L4.2 mV).  Of note, there is evidence of a median-to-ulnar crossover in the forearm as seen by a motor response stimulating at the ulnar-wrist and recording at the abductor pollicis brevis muscle bilaterally, consistent with a Martin-Gruber anastomosis.  Bilateral ulnar motor responses are within normal limits.   3. Chronic motor axonal loss changes are seen affecting bilateral abductor pollicis brevis muscles, without accompanied active denervation.  Impression: 1. Bilateral median neuropathy at or distal to the wrist (severe), consistent with a clinical diagnosis of carpal tunnel syndrome.   2. Incidentally, there is evidence of bilateral Martin-Gruber anastomoses, a normal anatomic variant.   ___________________________ Narda Amber, DO    Nerve Conduction Studies Anti Sensory Summary Table   Site NR Peak (ms) Norm Peak (ms) P-T Amp (V) Norm P-T Amp  Left Median Anti Sensory (2nd Digit)  34C  Wrist    5.5 <3.8 5.7 >10  Right Median Anti Sensory (2nd Digit)  34C  Wrist    6.3 <3.8 4.3 >10  Left Ulnar Anti Sensory (5th Digit)  34C  Wrist    2.8 <3.2 24.3 >5   Right Ulnar Anti Sensory (5th Digit)  34C  Wrist    2.5 <3.2 23.2 >5   Motor Summary Table   Site NR Onset (ms) Norm Onset (ms) O-P Amp (mV) Norm O-P Amp Site1 Site2 Delta-0 (ms) Dist (cm) Vel (m/s) Norm Vel (m/s)  Left Median Motor (Abd Poll Brev)  34C  Wrist    6.2 <4.0 4.2 >5 Elbow Wrist 4.0 28.0 70 >50  Elbow    10.2  4.1  Ulnar-wrist crossover Elbow 5.7 0.0    Ulnar-wrist crossover    4.5  2.6         Right Median Motor (Abd Poll Brev)  34C  Wrist    6.9 <4.0 4.5 >5 Elbow Wrist 3.8 29.0 76 >50  Elbow    10.7  4.2  Ulnar-wrist crossover Elbow 6.2 0.0    Ulnar-wrist crossover    4.5  2.6         Left Ulnar Motor (Abd Dig Minimi)  34C  Wrist    2.5 <3.1 6.5 >7 B Elbow Wrist 3.8 23.0 61 >50  B Elbow    6.3  6.2  A Elbow B Elbow 1.8 10.0 56 >50  A Elbow    8.1  5.2         Right Ulnar Motor (Abd Dig Minimi)  34C  Wrist    2.3 <3.1 7.2 >7 B Elbow Wrist 4.1 23.0 56 >50  B Elbow    6.4  6.5  A Elbow B Elbow 1.8 10.0 56 >50  A Elbow    8.2  6.0  EMG   Side Muscle Ins Act Fibs Psw Fasc Number Recrt Dur Dur. Amp Amp. Poly Poly. Comment  Right 1stDorInt Nml Nml Nml Nml Nml Nml Nml Nml Nml Nml Nml Nml N/A  Right Abd Poll Brev Nml Nml Nml Nml 1- Rapid Few 1+ Few 1+ Nml Nml N/A  Right PronatorTeres Nml Nml Nml Nml Nml Nml Nml Nml Nml Nml Nml Nml N/A  Right Triceps Nml Nml Nml Nml Nml Nml Nml Nml Nml Nml Nml Nml N/A  Right Biceps Nml Nml Nml Nml Nml Nml Nml Nml Nml Nml Nml Nml N/A  Right Deltoid Nml Nml Nml Nml Nml Nml Nml Nml Nml Nml Nml Nml N/A  Left 1stDorInt Nml Nml Nml Nml Nml Nml Nml Nml Nml Nml Nml Nml N/A  Left Abd Poll Brev Nml Nml Nml Nml 1- Rapid Few 1+ Few 1+ Nml Nml N/A  Left PronatorTeres Nml Nml Nml Nml Nml Nml Nml Nml Nml Nml Nml Nml N/A  Left Biceps Nml Nml Nml Nml Nml Nml Nml Nml Nml Nml Nml Nml N/A  Left Triceps Nml Nml Nml Nml Nml Nml Nml Nml Nml Nml Nml Nml N/A  Left Deltoid Nml Nml Nml Nml Nml Nml Nml Nml Nml Nml Nml Nml N/A      Waveforms:

## 2019-02-19 DIAGNOSIS — G5603 Carpal tunnel syndrome, bilateral upper limbs: Secondary | ICD-10-CM | POA: Diagnosis not present

## 2019-02-27 ENCOUNTER — Other Ambulatory Visit: Payer: Self-pay | Admitting: Family Medicine

## 2019-02-27 DIAGNOSIS — Z23 Encounter for immunization: Secondary | ICD-10-CM | POA: Diagnosis not present

## 2019-02-27 DIAGNOSIS — Z1231 Encounter for screening mammogram for malignant neoplasm of breast: Secondary | ICD-10-CM

## 2019-03-01 DIAGNOSIS — H1132 Conjunctival hemorrhage, left eye: Secondary | ICD-10-CM | POA: Diagnosis not present

## 2019-03-01 DIAGNOSIS — H401230 Low-tension glaucoma, bilateral, stage unspecified: Secondary | ICD-10-CM | POA: Diagnosis not present

## 2019-03-01 DIAGNOSIS — H18513 Endothelial corneal dystrophy, bilateral: Secondary | ICD-10-CM | POA: Diagnosis not present

## 2019-03-01 DIAGNOSIS — D3191 Benign neoplasm of unspecified part of right eye: Secondary | ICD-10-CM | POA: Diagnosis not present

## 2019-03-02 DIAGNOSIS — G5601 Carpal tunnel syndrome, right upper limb: Secondary | ICD-10-CM | POA: Diagnosis not present

## 2019-03-05 ENCOUNTER — Other Ambulatory Visit: Payer: Self-pay | Admitting: *Deleted

## 2019-03-05 DIAGNOSIS — R918 Other nonspecific abnormal finding of lung field: Secondary | ICD-10-CM

## 2019-03-06 ENCOUNTER — Encounter: Payer: Medicare Other | Admitting: Neurology

## 2019-03-06 IMAGING — NM REST RAW
3 series · 18 of 18 positions shown · non-contrast
Comparison: none

[Series 1: stress-gsp_(id)_sa · 6.4mm · 6.40mm/px · 6 of 512 frames shown]
[frame 43/512]
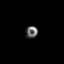
[frame 128/512]
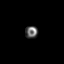
[frame 214/512]
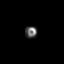
[frame 299/512]
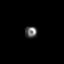
[frame 384/512]
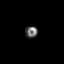
[frame 470/512]
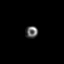

[Series 1: stress-sum-em_(id)_sa · 6.4mm · 6.40mm/px · 6 of 64 frames shown]
[frame 6/64]
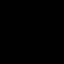
[frame 16/64]
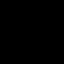
[frame 27/64]
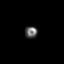
[frame 38/64]
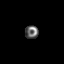
[frame 48/64]
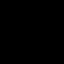
[frame 59/64]
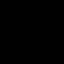

[Series 1: rest_(id)_sa · 6.4mm · 6.40mm/px · 6 of 64 frames shown]
[frame 6/64]
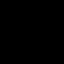
[frame 16/64]
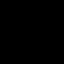
[frame 27/64]
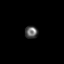
[frame 38/64]
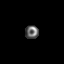
[frame 48/64]
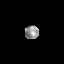
[frame 59/64]
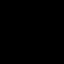

[18 of 18 positions shown; findings below may reference images not displayed]

Canned report from images found in remote index.

Refer to host system for actual result text.

## 2019-03-09 ENCOUNTER — Ambulatory Visit
Admission: RE | Admit: 2019-03-09 | Discharge: 2019-03-09 | Disposition: A | Discharge status: Home / Self Care | Payer: Medicare Other | Source: Ambulatory Visit | Attending: Pulmonary Disease | Admitting: Pulmonary Disease

## 2019-03-09 DIAGNOSIS — R918 Other nonspecific abnormal finding of lung field: Secondary | ICD-10-CM

## 2019-03-09 DIAGNOSIS — R911 Solitary pulmonary nodule: Secondary | ICD-10-CM | POA: Diagnosis not present

## 2019-03-12 ENCOUNTER — Inpatient Hospital Stay: Admission: RE | Admit: 2019-03-12 | Payer: Medicare Other | Source: Ambulatory Visit

## 2019-03-14 ENCOUNTER — Encounter: Payer: Self-pay | Admitting: Pulmonary Disease

## 2019-03-14 ENCOUNTER — Ambulatory Visit (INDEPENDENT_AMBULATORY_CARE_PROVIDER_SITE_OTHER): Payer: Medicare Other | Admitting: Pulmonary Disease

## 2019-03-14 ENCOUNTER — Other Ambulatory Visit: Payer: Self-pay

## 2019-03-14 VITALS — BP 128/64 | HR 77 | Ht 62.0 in | Wt 231.0 lb

## 2019-03-14 DIAGNOSIS — G4734 Idiopathic sleep related nonobstructive alveolar hypoventilation: Secondary | ICD-10-CM

## 2019-03-14 DIAGNOSIS — J9611 Chronic respiratory failure with hypoxia: Secondary | ICD-10-CM

## 2019-03-14 NOTE — Progress Notes (Signed)
[facility]  Chief Complaint  Patient presents with  . Follow-up    Pt had CT performed and is here to go over the results. Denies any complaints or concerns. Pt does wear 2L O2 at night.    Constitutional:  BP 128/64 (BP Location: Left Arm, Cuff Size: Large)   Pulse 77   Ht 5\' 2"  (1.575 m)   Wt 231 lb (104.8 kg)   SpO2 97%   BMI 42.25 kg/m   Past Medical History:  Glaucoma, Osteopenia, Hypothyroidism, HLD, GERD, DM, DJD  Brief Summary:  [name] is a 68 y.o. female with nocturnal hypoxemia and lung nodule.   She had f/u CT chest 03/09/19 (reviewed by me).  Small lung nodules stable in appearance, and no other significant findings.  She has been walking more and modified her diet.  She has lost about 15 lbs since last time I saw her.   Still using oxygen at night.  She is hoping with weight loss she won't need supplemental oxygen at night anymore.  She tried using arnuity.  Didn't make any difference so she stopped using.  Not having cough, wheeze, chest congestion, dyspnea.  Had recent carpal tunnel surgery on her Rt hand.   Physical Exam:   Appearance - well kempt   ENMT - clear nasal mucosa, midline nasal  septum, no oral exudates, no LAN, trachea midline  Respiratory - normal chest wall, normal respiratory effort, no accessory muscle use, no wheeze/rales  CV - s1s2 regular rate and rhythm, no murmurs, no peripheral edema, radial pulses symmetric  GI - soft, non tender, no masses  Lymph - no adenopathy noted in neck and axillary areas  MSK - normal gait  Ext - no cyanosis, clubbing, or joint inflammation noted  Skin - no rashes, lesions, or ulcers  Neuro - normal strength, oriented x 3  Psych - normal mood and affect   Assessment/Plan:   COPD with asthma. - very mild changes on PFT and no significant symptoms - didn't notice any clinical benefit from trial of inhaler therapy - conservative  management at this time  Nocturnal hypoxemia with obesity hypoventilation syndrome and COPD/asthma. - she is using 2 liters oxygen at night - she has lost significant amount of weight - will repeat ONO with room air and determine if she can d/c home oxygen set up  Obesity. - encouraged her to keep up with her diet and exercise regimen   Patient Instructions  Will arrange for overnight oxygen test, and call with results and determine if you need to have follow up appointment    Chesley Mires, MD [place] Pager: [phone] 03/14/2019, 9:28 AM  Flow Sheet     Pulmonary tests:  PFT 12/23/17 >> FEV1 1.60 (69%), FEV1% 65, TLC 4.61 (92%), DLCO 104%, +BD Spirometry 02/13/18 >> FEV1 1.26 (58%), FEV1% 72  Chest imaging:  CT chest 01/17/18 >> mild cluster of subpleural nodules RUL, 5 mm nodule LUL CT chest 09/22/18 >> decreased size of nodular cluster RUL, new cluster of nodules in LUL, stable 5 mm LUL nodule CT chest 03/09/19 >> 4 x 6 mm LUL nodule no change, 3 mm LLL nodule no change  Sleep tests:  HST 11/06/16 >> AHI 2.8, SpO2 low 85% ONO with RA 11/08/16 >> Average SpO2 88%, low SpO2 73%, Spent 5 hrs 7 min with SpO2 < 88% PSG 02/17/17 >> AHI 2.2, SpO2 low 80% ONO with RA 12/01/17 >>test time 7  hrs 54 min. Baseline SpO2 90%, low SpO2 82%. Spent 2 hrs 4 min with SpO2 <88%. ONO with RA 02/23/18 >>test time 8 hrs 1 min. Baseline SpO2 90%, low SpO2 79%. Spent 2 hrs 1 min with SpO2 <88%.  Cardiac tests:  Echo bubble study 08/17/17 >> EF 55 to 60%, mod LVH, grade 1 DD, no shunt   Medications:   Allergies as of 03/14/2019      Reactions   Ciprofloxacin Other (See Comments)   Dizziness/vertigo   Sulfa Antibiotics Swelling   Sulfasalazine Swelling      Medication List       Accurate as of March 14, 2019  9:28 AM. If you have any questions, ask your nurse or doctor.        carboxymethylcellulose 0.5 % Soln Commonly known as: REFRESH PLUS Place 1  drop into both eyes 3 (three) times daily as needed (for dry eyes).   cetirizine 10 MG tablet Commonly known as: ZYRTEC Take 10 mg by mouth at bedtime.   diclofenac sodium 1 % Gel Commonly known as: VOLTAREN Apply 1-2 g topically 3 (three) times daily as needed (for knee pain.).   esomeprazole 20 MG capsule Commonly known as: NEXIUM Take 20 mg by mouth daily as needed (for acid reflux/indigestion).   FISH OIL ULTRA PO Take 1,200 mg by mouth every Tuesday, Thursday, Saturday, and Sunday at 6 PM.   glimepiride 4 MG tablet Commonly known as: AMARYL Take 4 mg by mouth 2 (two) times daily.   ibuprofen 200 MG tablet Commonly known as: ADVIL Take 400-800 mg by mouth every 8 (eight) hours as needed for headache or moderate pain.   Jardiance 10 MG Tabs tablet Generic drug: empagliflozin Take 10 mg by mouth daily.   lisinopril 2.5 MG tablet Commonly known as: ZESTRIL   metFORMIN 500 MG tablet Commonly known as: GLUCOPHAGE Take 1,000 mg by mouth 2 (two) times daily with a meal.   multivitamin with minerals Tabs tablet Take 1 tablet by mouth at bedtime. CENTRUM   simvastatin 40 MG tablet Commonly known as: ZOCOR Take 40 mg by mouth every Monday, Wednesday, and Friday at 8 PM.   Synthroid 125 MCG tablet Generic drug: levothyroxine   Vitamin D-3 125 MCG (5000 UT) Tabs Take 5,000 Units by mouth every Monday, Wednesday, and Friday.       Past Surgical History:  She  has a past surgical history that includes Cholecystectomy; Tonsillectomy; Tubal ligation; THYROID NODULE REMOVAL; S/P COLONOSCOPY (06/2011); Breast reduction surgery; Knee arthroscopy (Left); Wrist surgery (Left); LEFT HEART CATH AND CORONARY ANGIOGRAPHY (N/A, 11/05/2016); Reduction mammaplasty (Bilateral); and TEE without cardioversion (N/A, 08/23/2017).  Family History:  Her family history includes Heart disease in her father.  Social History:  She  reports that she has never smoked. She has never used smokeless  tobacco. She reports current alcohol use. She reports that she does not use drugs.

## 2019-03-14 NOTE — Patient Instructions (Signed)
Will arrange for overnight oxygen test, and call with results and determine if you need to have follow up appointment

## 2019-03-19 DIAGNOSIS — E039 Hypothyroidism, unspecified: Secondary | ICD-10-CM | POA: Diagnosis not present

## 2019-03-19 DIAGNOSIS — H35033 Hypertensive retinopathy, bilateral: Secondary | ICD-10-CM | POA: Diagnosis not present

## 2019-03-19 DIAGNOSIS — E7800 Pure hypercholesterolemia, unspecified: Secondary | ICD-10-CM | POA: Diagnosis not present

## 2019-03-19 DIAGNOSIS — E119 Type 2 diabetes mellitus without complications: Secondary | ICD-10-CM | POA: Diagnosis not present

## 2019-03-19 DIAGNOSIS — Z7984 Long term (current) use of oral hypoglycemic drugs: Secondary | ICD-10-CM | POA: Diagnosis not present

## 2019-03-23 DIAGNOSIS — J449 Chronic obstructive pulmonary disease, unspecified: Secondary | ICD-10-CM | POA: Diagnosis not present

## 2019-03-23 DIAGNOSIS — R0902 Hypoxemia: Secondary | ICD-10-CM | POA: Diagnosis not present

## 2019-03-30 ENCOUNTER — Telehealth: Payer: Self-pay | Admitting: Pulmonary Disease

## 2019-03-30 NOTE — Telephone Encounter (Signed)
ONO with RA 03/23/19 >> test time 16 hrs 3 min.  Baseline SpO2 89%, low SpO2 66%.  Spent 2 hrs 58 min with SpO2 < 88%.   Please let her know her oxygen level is still low at night.  She should continue using supplemental oxygen when asleep.

## 2019-03-30 NOTE — Telephone Encounter (Signed)
Called and spoke to patient. Gave results per Dr. Halford Chessman. Patient verbalized understanding and reported that she suspected that she would need to continue wearing supplemental oxygen at night.  Patient has no further questions at this time.  Closing encounter.

## 2019-04-02 DIAGNOSIS — H04123 Dry eye syndrome of bilateral lacrimal glands: Secondary | ICD-10-CM | POA: Diagnosis not present

## 2019-04-09 DIAGNOSIS — H04123 Dry eye syndrome of bilateral lacrimal glands: Secondary | ICD-10-CM | POA: Diagnosis not present

## 2019-04-17 ENCOUNTER — Other Ambulatory Visit: Payer: Self-pay

## 2019-04-17 ENCOUNTER — Ambulatory Visit
Admission: RE | Admit: 2019-04-17 | Discharge: 2019-04-17 | Disposition: A | Discharge status: Home / Self Care | Payer: Medicare Other | Source: Ambulatory Visit

## 2019-04-17 DIAGNOSIS — Z1231 Encounter for screening mammogram for malignant neoplasm of breast: Secondary | ICD-10-CM

## 2019-05-16 DIAGNOSIS — G4734 Idiopathic sleep related nonobstructive alveolar hypoventilation: Secondary | ICD-10-CM | POA: Diagnosis not present

## 2019-05-16 DIAGNOSIS — E039 Hypothyroidism, unspecified: Secondary | ICD-10-CM | POA: Diagnosis not present

## 2019-05-16 DIAGNOSIS — J3089 Other allergic rhinitis: Secondary | ICD-10-CM | POA: Diagnosis not present

## 2019-05-16 DIAGNOSIS — K219 Gastro-esophageal reflux disease without esophagitis: Secondary | ICD-10-CM | POA: Diagnosis not present

## 2019-05-16 DIAGNOSIS — E7800 Pure hypercholesterolemia, unspecified: Secondary | ICD-10-CM | POA: Diagnosis not present

## 2019-05-16 DIAGNOSIS — E119 Type 2 diabetes mellitus without complications: Secondary | ICD-10-CM | POA: Diagnosis not present

## 2019-05-19 ENCOUNTER — Ambulatory Visit: Payer: Medicare Other | Attending: Internal Medicine

## 2019-05-19 DIAGNOSIS — Z23 Encounter for immunization: Secondary | ICD-10-CM | POA: Insufficient documentation

## 2019-05-19 NOTE — Progress Notes (Signed)
   Covid-19 Vaccination Clinic  Name:  [name]    MRN: [id number] DOB: 11/06/1950  05/19/2019  [name] was observed post Covid-19 immunization for 15 minutes without incidence. She was provided with Vaccine Information Sheet and instruction to access the V-Safe system.   [name] was instructed to call 911 with any severe reactions post vaccine: [name] Difficulty breathing  . Swelling of your face and throat  . A fast heartbeat  . A bad rash all over your body  . Dizziness and weakness    Immunizations Administered    Name Date Dose VIS Date Route   Pfizer COVID-19 Vaccine 05/19/2019 12:54 PM 0.3 mL 04/20/2019 Intramuscular   Manufacturer: [place]   Lot: [id number]   [place]: [id number]

## 2019-05-21 DIAGNOSIS — Z7984 Long term (current) use of oral hypoglycemic drugs: Secondary | ICD-10-CM | POA: Diagnosis not present

## 2019-05-21 DIAGNOSIS — E7800 Pure hypercholesterolemia, unspecified: Secondary | ICD-10-CM | POA: Diagnosis not present

## 2019-05-21 DIAGNOSIS — E119 Type 2 diabetes mellitus without complications: Secondary | ICD-10-CM | POA: Diagnosis not present

## 2019-05-21 DIAGNOSIS — G4734 Idiopathic sleep related nonobstructive alveolar hypoventilation: Secondary | ICD-10-CM | POA: Diagnosis not present

## 2019-05-21 DIAGNOSIS — E039 Hypothyroidism, unspecified: Secondary | ICD-10-CM | POA: Diagnosis not present

## 2019-05-21 DIAGNOSIS — J3089 Other allergic rhinitis: Secondary | ICD-10-CM | POA: Diagnosis not present

## 2019-05-21 DIAGNOSIS — H35033 Hypertensive retinopathy, bilateral: Secondary | ICD-10-CM | POA: Diagnosis not present

## 2019-05-21 DIAGNOSIS — K219 Gastro-esophageal reflux disease without esophagitis: Secondary | ICD-10-CM | POA: Diagnosis not present

## 2019-05-30 DIAGNOSIS — E039 Hypothyroidism, unspecified: Secondary | ICD-10-CM | POA: Diagnosis not present

## 2019-05-30 DIAGNOSIS — E119 Type 2 diabetes mellitus without complications: Secondary | ICD-10-CM | POA: Diagnosis not present

## 2019-05-30 DIAGNOSIS — E7800 Pure hypercholesterolemia, unspecified: Secondary | ICD-10-CM | POA: Diagnosis not present

## 2019-05-30 DIAGNOSIS — H35033 Hypertensive retinopathy, bilateral: Secondary | ICD-10-CM | POA: Diagnosis not present

## 2019-06-08 ENCOUNTER — Ambulatory Visit: Payer: Self-pay

## 2019-06-09 ENCOUNTER — Ambulatory Visit: Payer: Medicare Other | Attending: Internal Medicine

## 2019-06-09 DIAGNOSIS — Z23 Encounter for immunization: Secondary | ICD-10-CM

## 2019-06-09 NOTE — Progress Notes (Signed)
   Covid-19 Vaccination Clinic  Name:  [name]    MRN: [id number] DOB: 1950-06-19  06/09/2019  [name] was observed post Covid-19 immunization for 15 minutes without incidence. She was provided with Vaccine Information Sheet and instruction to access the V-Safe system.   [name] was instructed to call 911 with any severe reactions post vaccine: [name] Difficulty breathing  . Swelling of your face and throat  . A fast heartbeat  . A bad rash all over your body  . Dizziness and weakness    Immunizations Administered    Name Date Dose VIS Date Route   Pfizer COVID-19 Vaccine 06/09/2019 10:38 AM 0.3 mL 04/20/2019 Intramuscular   Manufacturer: [place]   Lot: [id number]   [place]: [id number]

## 2019-06-26 DIAGNOSIS — E119 Type 2 diabetes mellitus without complications: Secondary | ICD-10-CM | POA: Diagnosis not present

## 2019-06-26 DIAGNOSIS — Z6841 Body Mass Index (BMI) 40.0 and over, adult: Secondary | ICD-10-CM | POA: Diagnosis not present

## 2019-06-26 DIAGNOSIS — E039 Hypothyroidism, unspecified: Secondary | ICD-10-CM | POA: Diagnosis not present

## 2019-06-26 DIAGNOSIS — Z7189 Other specified counseling: Secondary | ICD-10-CM | POA: Diagnosis not present

## 2019-06-27 DIAGNOSIS — M8589 Other specified disorders of bone density and structure, multiple sites: Secondary | ICD-10-CM | POA: Diagnosis not present

## 2019-06-28 DIAGNOSIS — H35033 Hypertensive retinopathy, bilateral: Secondary | ICD-10-CM | POA: Diagnosis not present

## 2019-06-28 DIAGNOSIS — E039 Hypothyroidism, unspecified: Secondary | ICD-10-CM | POA: Diagnosis not present

## 2019-06-28 DIAGNOSIS — E119 Type 2 diabetes mellitus without complications: Secondary | ICD-10-CM | POA: Diagnosis not present

## 2019-06-28 DIAGNOSIS — E7800 Pure hypercholesterolemia, unspecified: Secondary | ICD-10-CM | POA: Diagnosis not present

## 2019-07-02 DIAGNOSIS — H401230 Low-tension glaucoma, bilateral, stage unspecified: Secondary | ICD-10-CM | POA: Diagnosis not present

## 2019-07-17 DIAGNOSIS — Z7189 Other specified counseling: Secondary | ICD-10-CM | POA: Diagnosis not present

## 2019-07-17 DIAGNOSIS — E039 Hypothyroidism, unspecified: Secondary | ICD-10-CM | POA: Diagnosis not present

## 2019-07-17 DIAGNOSIS — E119 Type 2 diabetes mellitus without complications: Secondary | ICD-10-CM | POA: Diagnosis not present

## 2019-07-17 DIAGNOSIS — Z6841 Body Mass Index (BMI) 40.0 and over, adult: Secondary | ICD-10-CM | POA: Diagnosis not present

## 2019-07-17 DIAGNOSIS — E210 Primary hyperparathyroidism: Secondary | ICD-10-CM | POA: Diagnosis not present

## 2019-07-19 ENCOUNTER — Other Ambulatory Visit (HOSPITAL_COMMUNITY): Payer: Self-pay | Admitting: Internal Medicine

## 2019-07-19 DIAGNOSIS — E210 Primary hyperparathyroidism: Secondary | ICD-10-CM

## 2019-07-27 ENCOUNTER — Ambulatory Visit (HOSPITAL_COMMUNITY)
Admission: RE | Admit: 2019-07-27 | Discharge: 2019-07-27 | Disposition: A | Discharge status: Home / Self Care | Payer: Medicare Other | Source: Ambulatory Visit | Attending: Internal Medicine | Admitting: Internal Medicine

## 2019-07-27 ENCOUNTER — Encounter (HOSPITAL_COMMUNITY)
Admission: RE | Admit: 2019-07-27 | Discharge: 2019-07-27 | Disposition: A | Discharge status: Home / Self Care | Payer: Medicare Other | Source: Ambulatory Visit | Attending: Internal Medicine | Admitting: Internal Medicine

## 2019-07-27 ENCOUNTER — Other Ambulatory Visit: Payer: Self-pay

## 2019-07-27 DIAGNOSIS — E210 Primary hyperparathyroidism: Secondary | ICD-10-CM | POA: Insufficient documentation

## 2019-07-27 MED ORDER — TECHNETIUM TC 99M SESTAMIBI GENERIC - CARDIOLITE
25.0000 | Freq: Once | INTRAVENOUS | Status: AC | PRN
  Administered 2019-07-27: 25 via INTRAVENOUS

## 2019-07-30 DIAGNOSIS — E039 Hypothyroidism, unspecified: Secondary | ICD-10-CM | POA: Diagnosis not present

## 2019-07-30 DIAGNOSIS — E7800 Pure hypercholesterolemia, unspecified: Secondary | ICD-10-CM | POA: Diagnosis not present

## 2019-07-30 DIAGNOSIS — H35033 Hypertensive retinopathy, bilateral: Secondary | ICD-10-CM | POA: Diagnosis not present

## 2019-07-30 DIAGNOSIS — E119 Type 2 diabetes mellitus without complications: Secondary | ICD-10-CM | POA: Diagnosis not present

## 2019-08-28 DIAGNOSIS — E119 Type 2 diabetes mellitus without complications: Secondary | ICD-10-CM | POA: Diagnosis not present

## 2019-08-28 DIAGNOSIS — E039 Hypothyroidism, unspecified: Secondary | ICD-10-CM | POA: Diagnosis not present

## 2019-08-28 DIAGNOSIS — H35033 Hypertensive retinopathy, bilateral: Secondary | ICD-10-CM | POA: Diagnosis not present

## 2019-08-28 DIAGNOSIS — E7800 Pure hypercholesterolemia, unspecified: Secondary | ICD-10-CM | POA: Diagnosis not present

## 2019-09-21 DIAGNOSIS — H35033 Hypertensive retinopathy, bilateral: Secondary | ICD-10-CM | POA: Diagnosis not present

## 2019-09-21 DIAGNOSIS — E119 Type 2 diabetes mellitus without complications: Secondary | ICD-10-CM | POA: Diagnosis not present

## 2019-09-21 DIAGNOSIS — E039 Hypothyroidism, unspecified: Secondary | ICD-10-CM | POA: Diagnosis not present

## 2019-09-21 DIAGNOSIS — E7800 Pure hypercholesterolemia, unspecified: Secondary | ICD-10-CM | POA: Diagnosis not present

## 2019-11-30 DIAGNOSIS — E7800 Pure hypercholesterolemia, unspecified: Secondary | ICD-10-CM | POA: Diagnosis not present

## 2019-11-30 DIAGNOSIS — E039 Hypothyroidism, unspecified: Secondary | ICD-10-CM | POA: Diagnosis not present

## 2019-11-30 DIAGNOSIS — E11319 Type 2 diabetes mellitus with unspecified diabetic retinopathy without macular edema: Secondary | ICD-10-CM | POA: Diagnosis not present

## 2019-11-30 DIAGNOSIS — E119 Type 2 diabetes mellitus without complications: Secondary | ICD-10-CM | POA: Diagnosis not present

## 2019-11-30 DIAGNOSIS — E1169 Type 2 diabetes mellitus with other specified complication: Secondary | ICD-10-CM | POA: Diagnosis not present

## 2019-12-19 ENCOUNTER — Other Ambulatory Visit: Payer: Self-pay | Admitting: Surgery

## 2019-12-19 DIAGNOSIS — E213 Hyperparathyroidism, unspecified: Secondary | ICD-10-CM

## 2019-12-19 DIAGNOSIS — E210 Primary hyperparathyroidism: Secondary | ICD-10-CM | POA: Diagnosis not present

## 2019-12-26 ENCOUNTER — Ambulatory Visit
Admission: RE | Admit: 2019-12-26 | Discharge: 2019-12-26 | Disposition: A | Discharge status: Home / Self Care | Payer: Medicare Other | Source: Ambulatory Visit | Attending: Surgery | Admitting: Surgery

## 2019-12-26 DIAGNOSIS — E041 Nontoxic single thyroid nodule: Secondary | ICD-10-CM | POA: Diagnosis not present

## 2019-12-26 DIAGNOSIS — E213 Hyperparathyroidism, unspecified: Secondary | ICD-10-CM

## 2019-12-27 ENCOUNTER — Encounter: Payer: Self-pay | Admitting: Genetic Counselor

## 2019-12-27 DIAGNOSIS — E7800 Pure hypercholesterolemia, unspecified: Secondary | ICD-10-CM | POA: Diagnosis not present

## 2019-12-27 DIAGNOSIS — G4734 Idiopathic sleep related nonobstructive alveolar hypoventilation: Secondary | ICD-10-CM

## 2019-12-27 DIAGNOSIS — E039 Hypothyroidism, unspecified: Secondary | ICD-10-CM | POA: Diagnosis not present

## 2019-12-27 DIAGNOSIS — E119 Type 2 diabetes mellitus without complications: Secondary | ICD-10-CM | POA: Diagnosis not present

## 2019-12-27 DIAGNOSIS — J9611 Chronic respiratory failure with hypoxia: Secondary | ICD-10-CM

## 2019-12-27 DIAGNOSIS — E11319 Type 2 diabetes mellitus with unspecified diabetic retinopathy without macular edema: Secondary | ICD-10-CM | POA: Diagnosis not present

## 2019-12-27 DIAGNOSIS — E1169 Type 2 diabetes mellitus with other specified complication: Secondary | ICD-10-CM | POA: Diagnosis not present

## 2019-12-27 NOTE — Telephone Encounter (Signed)
Dr. Halford Chessman please advise on patient mychart message. She has not been seen since 03/14/2019. Told her she may need to schedule an appointment but would check with you  [facility] called me yesterday saying I need to have my Doctor recheck if I still need O2 at night.   I am requesting Dr Halford Chessman to reorder another nighttime O2 monitor.  Thank you Andreana Klingerman  dob [birth year]

## 2019-12-27 NOTE — Addendum Note (Signed)
Addended by: Lia Foyer R on: 12/27/2019 04:34 PM   Modules accepted: Orders

## 2019-12-31 ENCOUNTER — Other Ambulatory Visit: Payer: Self-pay | Admitting: Surgery

## 2019-12-31 DIAGNOSIS — E210 Primary hyperparathyroidism: Secondary | ICD-10-CM

## 2020-01-02 ENCOUNTER — Encounter: Payer: Self-pay | Admitting: Pulmonary Disease

## 2020-01-02 DIAGNOSIS — G4730 Sleep apnea, unspecified: Secondary | ICD-10-CM | POA: Diagnosis not present

## 2020-01-02 DIAGNOSIS — R0683 Snoring: Secondary | ICD-10-CM | POA: Diagnosis not present

## 2020-01-03 ENCOUNTER — Ambulatory Visit: Payer: Medicare Other | Admitting: Adult Health

## 2020-01-04 ENCOUNTER — Other Ambulatory Visit: Payer: Self-pay

## 2020-01-04 ENCOUNTER — Ambulatory Visit
Admission: RE | Admit: 2020-01-04 | Discharge: 2020-01-04 | Disposition: A | Discharge status: Home / Self Care | Payer: Medicare Other | Source: Ambulatory Visit | Attending: Surgery | Admitting: Surgery

## 2020-01-04 DIAGNOSIS — E210 Primary hyperparathyroidism: Secondary | ICD-10-CM

## 2020-01-04 DIAGNOSIS — E042 Nontoxic multinodular goiter: Secondary | ICD-10-CM | POA: Diagnosis not present

## 2020-01-04 MED ORDER — IOPAMIDOL (ISOVUE-300) INJECTION 61%
100.0000 mL | Freq: Once | INTRAVENOUS | Status: AC | PRN
  Administered 2020-01-04: 100 mL via INTRAVENOUS

## 2020-01-07 ENCOUNTER — Other Ambulatory Visit: Payer: Medicare Other

## 2020-01-09 ENCOUNTER — Other Ambulatory Visit: Payer: Medicare Other

## 2020-01-15 ENCOUNTER — Telehealth: Payer: Self-pay | Admitting: Pulmonary Disease

## 2020-01-15 DIAGNOSIS — J9611 Chronic respiratory failure with hypoxia: Secondary | ICD-10-CM

## 2020-01-15 DIAGNOSIS — G4734 Idiopathic sleep related nonobstructive alveolar hypoventilation: Secondary | ICD-10-CM

## 2020-01-15 NOTE — Telephone Encounter (Signed)
Called and spoke with patient to let her know that her ONO revealed that she still needs O2 at night and when sleeping. New order has been sent to [facility] so that they are aware. Nothing further needed at this time.

## 2020-01-15 NOTE — Telephone Encounter (Signed)
Attempted to call pt but unable to reach. Left message for her to return call. 

## 2020-01-15 NOTE — Telephone Encounter (Signed)
Pt returning a phone call. Pt can be reached at [phone].

## 2020-01-15 NOTE — Telephone Encounter (Signed)
ONO with RA 01/02/20 >> test time 15 hrs 58 min.  Baseline SpO2 88%, low SpO2 70%.  Spent 8 hrs 57 min with SpO2 < 88%.   Please let her know that her oxygen level is still low at night and she should continue using supplemental oxygen at night.  Please make sure that [facility] is aware of results and that she needs to keep home oxygen set up.

## 2020-01-29 DIAGNOSIS — Z23 Encounter for immunization: Secondary | ICD-10-CM | POA: Diagnosis not present

## 2020-02-07 DIAGNOSIS — D2261 Melanocytic nevi of right upper limb, including shoulder: Secondary | ICD-10-CM | POA: Diagnosis not present

## 2020-02-07 DIAGNOSIS — D1801 Hemangioma of skin and subcutaneous tissue: Secondary | ICD-10-CM | POA: Diagnosis not present

## 2020-02-07 DIAGNOSIS — D2272 Melanocytic nevi of left lower limb, including hip: Secondary | ICD-10-CM | POA: Diagnosis not present

## 2020-02-07 DIAGNOSIS — Z85828 Personal history of other malignant neoplasm of skin: Secondary | ICD-10-CM | POA: Diagnosis not present

## 2020-02-07 DIAGNOSIS — D225 Melanocytic nevi of trunk: Secondary | ICD-10-CM | POA: Diagnosis not present

## 2020-02-07 DIAGNOSIS — L918 Other hypertrophic disorders of the skin: Secondary | ICD-10-CM | POA: Diagnosis not present

## 2020-02-07 DIAGNOSIS — D2262 Melanocytic nevi of left upper limb, including shoulder: Secondary | ICD-10-CM | POA: Diagnosis not present

## 2020-02-07 DIAGNOSIS — D2239 Melanocytic nevi of other parts of face: Secondary | ICD-10-CM | POA: Diagnosis not present

## 2020-02-07 DIAGNOSIS — D2271 Melanocytic nevi of right lower limb, including hip: Secondary | ICD-10-CM | POA: Diagnosis not present

## 2020-02-12 DIAGNOSIS — Z23 Encounter for immunization: Secondary | ICD-10-CM | POA: Diagnosis not present

## 2020-02-17 DIAGNOSIS — E1169 Type 2 diabetes mellitus with other specified complication: Secondary | ICD-10-CM | POA: Diagnosis not present

## 2020-02-17 DIAGNOSIS — E11319 Type 2 diabetes mellitus with unspecified diabetic retinopathy without macular edema: Secondary | ICD-10-CM | POA: Diagnosis not present

## 2020-02-17 DIAGNOSIS — E119 Type 2 diabetes mellitus without complications: Secondary | ICD-10-CM | POA: Diagnosis not present

## 2020-02-17 DIAGNOSIS — E7800 Pure hypercholesterolemia, unspecified: Secondary | ICD-10-CM | POA: Diagnosis not present

## 2020-02-17 DIAGNOSIS — E039 Hypothyroidism, unspecified: Secondary | ICD-10-CM | POA: Diagnosis not present

## 2020-03-10 DIAGNOSIS — E210 Primary hyperparathyroidism: Secondary | ICD-10-CM | POA: Diagnosis not present

## 2020-03-17 DIAGNOSIS — Z6841 Body Mass Index (BMI) 40.0 and over, adult: Secondary | ICD-10-CM | POA: Diagnosis not present

## 2020-03-17 DIAGNOSIS — E039 Hypothyroidism, unspecified: Secondary | ICD-10-CM | POA: Diagnosis not present

## 2020-03-17 DIAGNOSIS — E041 Nontoxic single thyroid nodule: Secondary | ICD-10-CM | POA: Diagnosis not present

## 2020-03-17 DIAGNOSIS — E119 Type 2 diabetes mellitus without complications: Secondary | ICD-10-CM | POA: Diagnosis not present

## 2020-03-17 DIAGNOSIS — E210 Primary hyperparathyroidism: Secondary | ICD-10-CM | POA: Diagnosis not present

## 2020-04-17 DIAGNOSIS — Z1231 Encounter for screening mammogram for malignant neoplasm of breast: Secondary | ICD-10-CM

## 2020-04-17 DIAGNOSIS — E11319 Type 2 diabetes mellitus with unspecified diabetic retinopathy without macular edema: Secondary | ICD-10-CM | POA: Diagnosis not present

## 2020-04-17 DIAGNOSIS — E119 Type 2 diabetes mellitus without complications: Secondary | ICD-10-CM | POA: Diagnosis not present

## 2020-04-17 DIAGNOSIS — E1169 Type 2 diabetes mellitus with other specified complication: Secondary | ICD-10-CM | POA: Diagnosis not present

## 2020-04-17 DIAGNOSIS — K219 Gastro-esophageal reflux disease without esophagitis: Secondary | ICD-10-CM | POA: Diagnosis not present

## 2020-04-17 DIAGNOSIS — E039 Hypothyroidism, unspecified: Secondary | ICD-10-CM | POA: Diagnosis not present

## 2020-04-17 DIAGNOSIS — E7800 Pure hypercholesterolemia, unspecified: Secondary | ICD-10-CM | POA: Diagnosis not present

## 2020-05-19 DIAGNOSIS — Z1231 Encounter for screening mammogram for malignant neoplasm of breast: Secondary | ICD-10-CM

## 2020-06-02 ENCOUNTER — Other Ambulatory Visit: Payer: Self-pay | Admitting: Family Medicine

## 2020-06-02 DIAGNOSIS — Z1231 Encounter for screening mammogram for malignant neoplasm of breast: Secondary | ICD-10-CM

## 2020-06-19 DIAGNOSIS — E785 Hyperlipidemia, unspecified: Secondary | ICD-10-CM | POA: Diagnosis not present

## 2020-06-19 DIAGNOSIS — E119 Type 2 diabetes mellitus without complications: Secondary | ICD-10-CM | POA: Diagnosis not present

## 2020-06-19 DIAGNOSIS — E7800 Pure hypercholesterolemia, unspecified: Secondary | ICD-10-CM | POA: Diagnosis not present

## 2020-06-19 DIAGNOSIS — E11319 Type 2 diabetes mellitus with unspecified diabetic retinopathy without macular edema: Secondary | ICD-10-CM | POA: Diagnosis not present

## 2020-06-19 DIAGNOSIS — E1169 Type 2 diabetes mellitus with other specified complication: Secondary | ICD-10-CM | POA: Diagnosis not present

## 2020-06-19 DIAGNOSIS — E039 Hypothyroidism, unspecified: Secondary | ICD-10-CM | POA: Diagnosis not present

## 2020-06-19 DIAGNOSIS — K219 Gastro-esophageal reflux disease without esophagitis: Secondary | ICD-10-CM | POA: Diagnosis not present

## 2020-06-23 ENCOUNTER — Other Ambulatory Visit: Payer: Self-pay

## 2020-06-23 ENCOUNTER — Ambulatory Visit
Admission: RE | Admit: 2020-06-23 | Discharge: 2020-06-23 | Disposition: A | Discharge status: Home / Self Care | Payer: Medicare Other | Source: Ambulatory Visit

## 2020-06-23 DIAGNOSIS — Z1231 Encounter for screening mammogram for malignant neoplasm of breast: Secondary | ICD-10-CM

## 2020-07-17 DIAGNOSIS — E210 Primary hyperparathyroidism: Secondary | ICD-10-CM | POA: Diagnosis not present

## 2020-08-12 DIAGNOSIS — E7800 Pure hypercholesterolemia, unspecified: Secondary | ICD-10-CM | POA: Diagnosis not present

## 2020-08-12 DIAGNOSIS — E039 Hypothyroidism, unspecified: Secondary | ICD-10-CM | POA: Diagnosis not present

## 2020-08-12 DIAGNOSIS — K219 Gastro-esophageal reflux disease without esophagitis: Secondary | ICD-10-CM | POA: Diagnosis not present

## 2020-08-12 DIAGNOSIS — E119 Type 2 diabetes mellitus without complications: Secondary | ICD-10-CM | POA: Diagnosis not present

## 2020-08-12 DIAGNOSIS — E785 Hyperlipidemia, unspecified: Secondary | ICD-10-CM | POA: Diagnosis not present

## 2020-08-12 DIAGNOSIS — E1169 Type 2 diabetes mellitus with other specified complication: Secondary | ICD-10-CM | POA: Diagnosis not present

## 2020-08-12 DIAGNOSIS — E11319 Type 2 diabetes mellitus with unspecified diabetic retinopathy without macular edema: Secondary | ICD-10-CM | POA: Diagnosis not present

## 2020-09-09 ENCOUNTER — Other Ambulatory Visit: Payer: Self-pay

## 2020-09-09 ENCOUNTER — Ambulatory Visit: Payer: Medicare Other | Admitting: Pulmonary Disease

## 2020-09-09 ENCOUNTER — Encounter: Payer: Self-pay | Admitting: Pulmonary Disease

## 2020-09-09 VITALS — BP 122/70 | HR 75 | Temp 97.7°F | Ht 63.0 in | Wt 241.2 lb

## 2020-09-09 DIAGNOSIS — J9611 Chronic respiratory failure with hypoxia: Secondary | ICD-10-CM | POA: Diagnosis not present

## 2020-09-09 DIAGNOSIS — G4734 Idiopathic sleep related nonobstructive alveolar hypoventilation: Secondary | ICD-10-CM | POA: Diagnosis not present

## 2020-09-09 NOTE — Progress Notes (Signed)
[name]  Chief Complaint  Patient presents with  . Follow-up    No complaints currently    Constitutional:  BP 122/70 (BP Location: Right Arm, Cuff Size: Large)   Pulse 75   Temp 97.7 F (36.5 C) (Temporal)   Ht 5\' 3"  (1.6 m)   Wt 241 lb 3.2 oz (109.4 kg)   SpO2 97% Comment: Room air  BMI 42.73 kg/m   Past Medical History:  Glaucoma, Osteopenia, Hypothyroidism, HLD, GERD, DM, DJD  Past Surgical History:  She  has a past surgical history that includes Cholecystectomy; Tonsillectomy; Tubal ligation; THYROID NODULE REMOVAL; S/P COLONOSCOPY (06/2011); Breast reduction surgery; Knee arthroscopy (Left); Wrist surgery (Left); LEFT HEART CATH AND CORONARY ANGIOGRAPHY (N/A, 11/05/2016); Reduction mammaplasty (Bilateral); and TEE without cardioversion (N/A, 08/23/2017).  Brief Summary:  [name] is a 70 y.o. female with nocturnal hypoxemia.       Subjective:   She had several questions about her set - POC versus stationary, continuous flow versus pulsed, CPAP/Bipap versus supplemental oxygen.  She purchased a POC on her own and this has been working.  She still gets supplies from [facility], but this is no longer in her insurance plan.  She is paying about $150 per month for the stationary concentrator and tanks, but not using these.  Physical Exam:   Appearance - well kempt   ENMT - no sinus tenderness, no oral exudate, no LAN, Mallampati 3 airway, no stridor  Respiratory - equal breath sounds bilaterally, no wheezing or rales  CV - s1s2 regular rate and rhythm, no murmurs  Ext - no clubbing, no edema  Skin - no rashes  Psych - normal mood and affect   Pulmonary testing:   PFT 12/23/17 >> FEV1 1.60 (69%), FEV1% 65, TLC 4.61 (92%), DLCO 104%, +BD  Spirometry 02/13/18 >> FEV1 1.26 (58%), FEV1% 72  Chest Imaging:   CT chest 01/17/18 >> mild cluster of subpleural nodules RUL, 5 mm nodule LUL  CT chest 09/22/18 >>  decreased size of nodular cluster RUL, new cluster of nodules in LUL, stable 5 mm LUL nodule  CT chest 03/09/19 >> 4 x 6 mm LUL nodule no change, 3 mm LLL nodule no change  Sleep Tests:   HST 11/06/16 >> AHI 2.8, SpO2 low 85%  ONO with RA 11/08/16 >> Average SpO2 88%, low SpO2 73%, Spent 5 hrs 7 min with SpO2 < 88%  PSG 02/17/17 >> AHI 2.2, SpO2 low 80%  ONO with RA 12/01/17 >>test time 7 hrs 54 min. Baseline SpO2 90%, low SpO2 82%. Spent 2 hrs 4 min with SpO2 <88%.  ONO with RA 02/23/18 >>test time 8 hrs 1 min. Baseline SpO2 90%, low SpO2 79%. Spent 2 hrs 1 min with SpO2 <88%.  ONO with RA 03/23/19 >> test time 16 hrs 3 min.  Baseline SpO2 89%, low SpO2 66%.  Spent 2 hrs 58 min with SpO2 < 88%  ONO with RA 01/02/20 >> test time 15 hrs 58 min.  Baseline SpO2 88%, low SpO2 70%.  Spent 8 hrs 57 min with SpO2 < 88%.  Cardiac Tests:   Echo bubble study 08/17/17 >> EF 55 to 60%, mod LVH, grade 1 DD, no shunt  Social History:  She  reports that she has never smoked. She has never used smokeless tobacco. She reports current alcohol use. She reports that she does not use drugs.  Family History:  Her family history includes Breast cancer in  her mother and paternal aunt; Heart disease in her father.    Discussion:    Assessment/Plan:   Nocturnal hypoxemia with obesity hypoventilation syndromeand mild COPD/asthma. - continue 2 liters oxygen at night - she will use her portable oxygen concentrator - will assess at her next visit if she can change from [facility] to another DME to get supplies set up - will have [facility] discontinue her stationary oxygen concentrator and tanks  Obesity. - encouraged her to keep up with her diet and exercise regimen  Time Spent Involved in Patient Care on Day of Examination:  32 minutes  Follow up:  Patient Instructions  Will have [facility] remove your home oxygen set up  Follow up in 6 months   Medication List:   Allergies as of 09/09/2020       Reactions   Ciprofloxacin Other (See Comments)   Dizziness/vertigo   Sulfa Antibiotics Swelling   Sulfasalazine Swelling      Medication List       Accurate as of Sep 09, 2020 12:59 PM. If you have any questions, ask your nurse or doctor.        STOP taking these medications   FISH OIL ULTRA PO Stopped by: Chesley Mires, MD     TAKE these medications   carboxymethylcellulose 0.5 % Soln Commonly known as: REFRESH PLUS Place 1 drop into both eyes 3 (three) times daily as needed (for dry eyes).   cetirizine 10 MG tablet Commonly known as: ZYRTEC Take 10 mg by mouth at bedtime.   diclofenac sodium 1 % Gel Commonly known as: VOLTAREN Apply 1-2 g topically 3 (three) times daily as needed (for knee pain.).   empagliflozin 10 MG Tabs tablet Commonly known as: JARDIANCE Take 10 mg by mouth daily.   esomeprazole 20 MG capsule Commonly known as: NEXIUM Take 20 mg by mouth daily as needed (for acid reflux/indigestion).   glimepiride 4 MG tablet Commonly known as: AMARYL Take 4 mg by mouth 2 (two) times daily.   ibuprofen 200 MG tablet Commonly known as: ADVIL Take 400-800 mg by mouth every 8 (eight) hours as needed for headache or moderate pain.   lisinopril 2.5 MG tablet Commonly known as: ZESTRIL   metFORMIN 500 MG tablet Commonly known as: GLUCOPHAGE Take 1,000 mg by mouth 2 (two) times daily with a meal.   multivitamin with minerals Tabs tablet Take 1 tablet by mouth at bedtime. CENTRUM   simvastatin 40 MG tablet Commonly known as: ZOCOR Take 40 mg by mouth every Monday, Wednesday, and Friday at 8 PM.   Synthroid 125 MCG tablet Generic drug: levothyroxine   Vitamin D-3 125 MCG (5000 UT) Tabs Take 5,000 Units by mouth every Monday, Wednesday, and Friday.       Signature:  Chesley Mires, MD [place] Pager - [phone] 09/09/2020, 12:59 PM

## 2020-09-09 NOTE — Patient Instructions (Signed)
Will have [facility] remove your home oxygen set up  Follow up in 6 months

## 2020-09-10 DIAGNOSIS — R350 Frequency of micturition: Secondary | ICD-10-CM | POA: Diagnosis not present

## 2020-09-12 ENCOUNTER — Ambulatory Visit: Payer: Medicare Other | Admitting: Pulmonary Disease

## 2020-09-18 ENCOUNTER — Other Ambulatory Visit (HOSPITAL_COMMUNITY): Payer: Self-pay | Admitting: Family Medicine

## 2020-09-18 DIAGNOSIS — R0989 Other specified symptoms and signs involving the circulatory and respiratory systems: Secondary | ICD-10-CM

## 2020-09-22 ENCOUNTER — Ambulatory Visit (HOSPITAL_COMMUNITY)
Admission: RE | Admit: 2020-09-22 | Discharge: 2020-09-22 | Disposition: A | Discharge status: Home / Self Care | Payer: Medicare Other | Source: Ambulatory Visit | Attending: Family Medicine | Admitting: Family Medicine

## 2020-09-22 ENCOUNTER — Other Ambulatory Visit: Payer: Self-pay

## 2020-09-22 DIAGNOSIS — R0989 Other specified symptoms and signs involving the circulatory and respiratory systems: Secondary | ICD-10-CM

## 2020-09-22 NOTE — Progress Notes (Signed)
VASCULAR LAB    ABIs have been performed.  See CV proc for preliminary results.   Sussan Meter, RVT 09/22/2020, 2:20 PM

## 2020-11-18 DIAGNOSIS — E1169 Type 2 diabetes mellitus with other specified complication: Secondary | ICD-10-CM | POA: Diagnosis not present

## 2020-11-18 DIAGNOSIS — E039 Hypothyroidism, unspecified: Secondary | ICD-10-CM | POA: Diagnosis not present

## 2020-11-18 DIAGNOSIS — E7800 Pure hypercholesterolemia, unspecified: Secondary | ICD-10-CM | POA: Diagnosis not present

## 2020-11-18 DIAGNOSIS — Z0000 Encounter for general adult medical examination without abnormal findings: Secondary | ICD-10-CM | POA: Diagnosis not present

## 2020-11-18 DIAGNOSIS — E213 Hyperparathyroidism, unspecified: Secondary | ICD-10-CM | POA: Diagnosis not present

## 2020-12-05 DIAGNOSIS — U071 COVID-19: Secondary | ICD-10-CM | POA: Diagnosis not present

## 2020-12-16 DIAGNOSIS — K219 Gastro-esophageal reflux disease without esophagitis: Secondary | ICD-10-CM | POA: Diagnosis not present

## 2020-12-16 DIAGNOSIS — E785 Hyperlipidemia, unspecified: Secondary | ICD-10-CM | POA: Diagnosis not present

## 2020-12-16 DIAGNOSIS — E11319 Type 2 diabetes mellitus with unspecified diabetic retinopathy without macular edema: Secondary | ICD-10-CM | POA: Diagnosis not present

## 2020-12-16 DIAGNOSIS — E119 Type 2 diabetes mellitus without complications: Secondary | ICD-10-CM | POA: Diagnosis not present

## 2020-12-16 DIAGNOSIS — E1169 Type 2 diabetes mellitus with other specified complication: Secondary | ICD-10-CM | POA: Diagnosis not present

## 2020-12-16 DIAGNOSIS — E039 Hypothyroidism, unspecified: Secondary | ICD-10-CM | POA: Diagnosis not present

## 2020-12-16 DIAGNOSIS — E7800 Pure hypercholesterolemia, unspecified: Secondary | ICD-10-CM | POA: Diagnosis not present

## 2020-12-26 DIAGNOSIS — M25562 Pain in left knee: Secondary | ICD-10-CM | POA: Diagnosis not present

## 2020-12-29 DIAGNOSIS — H35361 Drusen (degenerative) of macula, right eye: Secondary | ICD-10-CM | POA: Diagnosis not present

## 2020-12-29 DIAGNOSIS — E119 Type 2 diabetes mellitus without complications: Secondary | ICD-10-CM | POA: Diagnosis not present

## 2020-12-29 DIAGNOSIS — H401231 Low-tension glaucoma, bilateral, mild stage: Secondary | ICD-10-CM | POA: Diagnosis not present

## 2020-12-29 DIAGNOSIS — H524 Presbyopia: Secondary | ICD-10-CM | POA: Diagnosis not present

## 2020-12-29 DIAGNOSIS — H47323 Drusen of optic disc, bilateral: Secondary | ICD-10-CM | POA: Diagnosis not present

## 2021-02-05 ENCOUNTER — Other Ambulatory Visit (HOSPITAL_BASED_OUTPATIENT_CLINIC_OR_DEPARTMENT_OTHER): Payer: Self-pay

## 2021-02-05 ENCOUNTER — Ambulatory Visit: Payer: Medicare Other | Attending: Internal Medicine

## 2021-02-05 DIAGNOSIS — Z23 Encounter for immunization: Secondary | ICD-10-CM

## 2021-02-05 MED ORDER — INFLUENZA VAC A&B SA ADJ QUAD 0.5 ML IM PRSY
PREFILLED_SYRINGE | INTRAMUSCULAR | 0 refills | Status: AC
  Filled 2021-02-05: qty 0.5, 1d supply, fill #0

## 2021-02-05 MED ORDER — PFIZER COVID-19 VAC BIVALENT 30 MCG/0.3ML IM SUSP
INTRAMUSCULAR | 0 refills | Status: AC
  Filled 2021-02-05: qty 0.3, 1d supply, fill #0

## 2021-02-05 NOTE — Progress Notes (Signed)
   Covid-19 Vaccination Clinic  Name:  [name]    MRN: [id number] DOB: Feb 08, 1951  02/05/2021  [name] was observed post Covid-19 immunization for 15 minutes without incident. She was provided with Vaccine Information Sheet and instruction to access the V-Safe system.   [name] was instructed to call 911 with any severe reactions post vaccine: Difficulty breathing  Swelling of face and throat  A fast heartbeat  A bad rash all over body  Dizziness and weakness

## 2021-02-10 ENCOUNTER — Other Ambulatory Visit: Payer: Self-pay | Admitting: Internal Medicine

## 2021-02-10 DIAGNOSIS — E041 Nontoxic single thyroid nodule: Secondary | ICD-10-CM

## 2021-02-13 ENCOUNTER — Ambulatory Visit
Admission: RE | Admit: 2021-02-13 | Discharge: 2021-02-13 | Disposition: A | Discharge status: Home / Self Care | Payer: Medicare Other | Source: Ambulatory Visit | Attending: Internal Medicine | Admitting: Internal Medicine

## 2021-02-13 DIAGNOSIS — E041 Nontoxic single thyroid nodule: Secondary | ICD-10-CM

## 2021-02-13 DIAGNOSIS — E042 Nontoxic multinodular goiter: Secondary | ICD-10-CM | POA: Diagnosis not present

## 2021-02-13 DIAGNOSIS — E890 Postprocedural hypothyroidism: Secondary | ICD-10-CM | POA: Diagnosis not present

## 2021-03-24 DIAGNOSIS — E041 Nontoxic single thyroid nodule: Secondary | ICD-10-CM | POA: Diagnosis not present

## 2021-03-24 DIAGNOSIS — E210 Primary hyperparathyroidism: Secondary | ICD-10-CM | POA: Diagnosis not present

## 2021-03-24 DIAGNOSIS — E559 Vitamin D deficiency, unspecified: Secondary | ICD-10-CM | POA: Diagnosis not present

## 2021-03-24 DIAGNOSIS — E119 Type 2 diabetes mellitus without complications: Secondary | ICD-10-CM | POA: Diagnosis not present

## 2021-03-24 DIAGNOSIS — E039 Hypothyroidism, unspecified: Secondary | ICD-10-CM | POA: Diagnosis not present

## 2021-04-08 ENCOUNTER — Other Ambulatory Visit: Payer: Self-pay | Admitting: Family Medicine

## 2021-04-08 ENCOUNTER — Encounter: Payer: Self-pay | Admitting: Pulmonary Disease

## 2021-04-08 DIAGNOSIS — Z1231 Encounter for screening mammogram for malignant neoplasm of breast: Secondary | ICD-10-CM

## 2021-04-08 DIAGNOSIS — J9611 Chronic respiratory failure with hypoxia: Secondary | ICD-10-CM

## 2021-04-08 NOTE — Telephone Encounter (Signed)
Please let her know I have set an order for an overnight oximetry on room air.

## 2021-04-08 NOTE — Telephone Encounter (Signed)
Dr. Halford Chessman please advise on the following My Chart message,   [name] (supporting Chesley Mires, MD) 14 minutes ago (10:56 AM)  I have not been using any O2 at night for a few weeks now and have been sleeping very well., my portable machine broke.  I am requesting an overnight test to see if I still need the O2 at night.     [name]  Thank you

## 2021-04-28 DIAGNOSIS — D1801 Hemangioma of skin and subcutaneous tissue: Secondary | ICD-10-CM | POA: Diagnosis not present

## 2021-04-28 DIAGNOSIS — D2262 Melanocytic nevi of left upper limb, including shoulder: Secondary | ICD-10-CM | POA: Diagnosis not present

## 2021-04-28 DIAGNOSIS — Z85828 Personal history of other malignant neoplasm of skin: Secondary | ICD-10-CM | POA: Diagnosis not present

## 2021-04-28 DIAGNOSIS — L918 Other hypertrophic disorders of the skin: Secondary | ICD-10-CM | POA: Diagnosis not present

## 2021-04-28 DIAGNOSIS — D2272 Melanocytic nevi of left lower limb, including hip: Secondary | ICD-10-CM | POA: Diagnosis not present

## 2021-04-28 DIAGNOSIS — D225 Melanocytic nevi of trunk: Secondary | ICD-10-CM | POA: Diagnosis not present

## 2021-04-28 DIAGNOSIS — L821 Other seborrheic keratosis: Secondary | ICD-10-CM | POA: Diagnosis not present

## 2021-04-28 DIAGNOSIS — L570 Actinic keratosis: Secondary | ICD-10-CM | POA: Diagnosis not present

## 2021-04-28 DIAGNOSIS — L738 Other specified follicular disorders: Secondary | ICD-10-CM | POA: Diagnosis not present

## 2021-04-28 DIAGNOSIS — D2271 Melanocytic nevi of right lower limb, including hip: Secondary | ICD-10-CM | POA: Diagnosis not present

## 2021-04-28 DIAGNOSIS — L720 Epidermal cyst: Secondary | ICD-10-CM | POA: Diagnosis not present

## 2021-04-28 DIAGNOSIS — D2261 Melanocytic nevi of right upper limb, including shoulder: Secondary | ICD-10-CM | POA: Diagnosis not present

## 2021-05-13 ENCOUNTER — Encounter: Payer: Self-pay | Admitting: Pulmonary Disease

## 2021-05-14 NOTE — Telephone Encounter (Signed)
[facility] and [facility] have merged.  I know [facility] was booked out on ono's.  I called them and verified they are still waiting on machine to come in.  I called pt and made her aware of previous info.  She states she has had issues with [facility] and billing and wants to wait for [facility].  She states has not been using O2 for a while and this is to make sure she doesn't need it.  I told her ok & will fax order to [facility].  Will route back to triage so they can close MyChart message.

## 2021-05-19 DIAGNOSIS — E1169 Type 2 diabetes mellitus with other specified complication: Secondary | ICD-10-CM | POA: Diagnosis not present

## 2021-05-19 DIAGNOSIS — E11319 Type 2 diabetes mellitus with unspecified diabetic retinopathy without macular edema: Secondary | ICD-10-CM | POA: Diagnosis not present

## 2021-05-19 DIAGNOSIS — H35033 Hypertensive retinopathy, bilateral: Secondary | ICD-10-CM | POA: Diagnosis not present

## 2021-05-19 DIAGNOSIS — K219 Gastro-esophageal reflux disease without esophagitis: Secondary | ICD-10-CM | POA: Diagnosis not present

## 2021-05-19 DIAGNOSIS — E7800 Pure hypercholesterolemia, unspecified: Secondary | ICD-10-CM | POA: Diagnosis not present

## 2021-05-19 DIAGNOSIS — J449 Chronic obstructive pulmonary disease, unspecified: Secondary | ICD-10-CM | POA: Diagnosis not present

## 2021-05-19 DIAGNOSIS — E785 Hyperlipidemia, unspecified: Secondary | ICD-10-CM | POA: Diagnosis not present

## 2021-05-19 DIAGNOSIS — E039 Hypothyroidism, unspecified: Secondary | ICD-10-CM | POA: Diagnosis not present

## 2021-05-20 DIAGNOSIS — J449 Chronic obstructive pulmonary disease, unspecified: Secondary | ICD-10-CM | POA: Diagnosis not present

## 2021-05-20 DIAGNOSIS — E7800 Pure hypercholesterolemia, unspecified: Secondary | ICD-10-CM | POA: Diagnosis not present

## 2021-05-20 DIAGNOSIS — E1169 Type 2 diabetes mellitus with other specified complication: Secondary | ICD-10-CM | POA: Diagnosis not present

## 2021-05-20 DIAGNOSIS — E039 Hypothyroidism, unspecified: Secondary | ICD-10-CM | POA: Diagnosis not present

## 2021-05-20 DIAGNOSIS — E213 Hyperparathyroidism, unspecified: Secondary | ICD-10-CM | POA: Diagnosis not present

## 2021-06-11 DIAGNOSIS — K219 Gastro-esophageal reflux disease without esophagitis: Secondary | ICD-10-CM | POA: Diagnosis not present

## 2021-06-11 DIAGNOSIS — R0789 Other chest pain: Secondary | ICD-10-CM | POA: Diagnosis not present

## 2021-06-11 DIAGNOSIS — J449 Chronic obstructive pulmonary disease, unspecified: Secondary | ICD-10-CM | POA: Diagnosis not present

## 2021-06-15 DIAGNOSIS — H401231 Low-tension glaucoma, bilateral, mild stage: Secondary | ICD-10-CM | POA: Diagnosis not present

## 2021-06-25 ENCOUNTER — Ambulatory Visit
Admission: RE | Admit: 2021-06-25 | Discharge: 2021-06-25 | Disposition: A | Discharge status: Home / Self Care | Payer: Medicare Other | Source: Ambulatory Visit | Attending: Family Medicine | Admitting: Family Medicine

## 2021-06-25 DIAGNOSIS — Z1231 Encounter for screening mammogram for malignant neoplasm of breast: Secondary | ICD-10-CM | POA: Diagnosis not present

## 2021-07-07 DIAGNOSIS — J449 Chronic obstructive pulmonary disease, unspecified: Secondary | ICD-10-CM | POA: Diagnosis not present

## 2021-07-17 DIAGNOSIS — K219 Gastro-esophageal reflux disease without esophagitis: Secondary | ICD-10-CM | POA: Diagnosis not present

## 2021-07-17 DIAGNOSIS — R198 Other specified symptoms and signs involving the digestive system and abdomen: Secondary | ICD-10-CM | POA: Diagnosis not present

## 2021-08-25 ENCOUNTER — Encounter: Payer: Self-pay | Admitting: Pulmonary Disease

## 2021-08-25 NOTE — Telephone Encounter (Signed)
Looked at Dr. Juanetta Gosling papers in [place] and did not see anything for this patient. I called [facility] to request the ONO but I had to leave a VM. Will follow up again tomorrow to see if the results have been faxed to our office.  ?

## 2021-08-25 NOTE — Telephone Encounter (Signed)
Triage, can this ONO be located? It is approaching the 30 day mark.  ?

## 2021-09-09 DIAGNOSIS — R1013 Epigastric pain: Secondary | ICD-10-CM | POA: Diagnosis not present

## 2021-09-09 DIAGNOSIS — R194 Change in bowel habit: Secondary | ICD-10-CM | POA: Diagnosis not present

## 2021-09-09 DIAGNOSIS — Z8379 Family history of other diseases of the digestive system: Secondary | ICD-10-CM | POA: Diagnosis not present

## 2021-09-09 DIAGNOSIS — K219 Gastro-esophageal reflux disease without esophagitis: Secondary | ICD-10-CM | POA: Diagnosis not present

## 2021-09-11 NOTE — Telephone Encounter (Signed)
Called [facility] again and spoke with Amy. She stated that the respiratory therapist had already left for the day but that she would try to locate the ONO. If she is not able to find it, she will have Anderson Malta (RT) to find it on Monday and fax to our office.  ?

## 2021-09-14 NOTE — Telephone Encounter (Signed)
Spoke to [place] with [facility] and requested ONO.  ? ?Dr. Halford Chessman, please advise. thanks ?

## 2021-09-16 NOTE — Telephone Encounter (Signed)
Triage, can you guys help with this? thanks ?

## 2021-09-17 NOTE — Telephone Encounter (Signed)
Patient received ONO results and voiced no questions or concerns. She was told if she had any questions that she can call the office. Nothing further needed at this time!  ?

## 2021-11-25 DIAGNOSIS — E7800 Pure hypercholesterolemia, unspecified: Secondary | ICD-10-CM | POA: Diagnosis not present

## 2021-11-25 DIAGNOSIS — E213 Hyperparathyroidism, unspecified: Secondary | ICD-10-CM | POA: Diagnosis not present

## 2021-11-25 DIAGNOSIS — E11319 Type 2 diabetes mellitus with unspecified diabetic retinopathy without macular edema: Secondary | ICD-10-CM | POA: Diagnosis not present

## 2021-11-25 DIAGNOSIS — Z0000 Encounter for general adult medical examination without abnormal findings: Secondary | ICD-10-CM | POA: Diagnosis not present

## 2021-11-25 DIAGNOSIS — J449 Chronic obstructive pulmonary disease, unspecified: Secondary | ICD-10-CM | POA: Diagnosis not present

## 2021-11-25 DIAGNOSIS — K219 Gastro-esophageal reflux disease without esophagitis: Secondary | ICD-10-CM | POA: Diagnosis not present

## 2021-11-25 DIAGNOSIS — E039 Hypothyroidism, unspecified: Secondary | ICD-10-CM | POA: Diagnosis not present

## 2021-12-10 DIAGNOSIS — Z86010 Personal history of colonic polyps: Secondary | ICD-10-CM | POA: Diagnosis not present

## 2021-12-10 DIAGNOSIS — K2970 Gastritis, unspecified, without bleeding: Secondary | ICD-10-CM | POA: Diagnosis not present

## 2021-12-10 DIAGNOSIS — K529 Noninfective gastroenteritis and colitis, unspecified: Secondary | ICD-10-CM | POA: Diagnosis not present

## 2021-12-10 DIAGNOSIS — R1013 Epigastric pain: Secondary | ICD-10-CM | POA: Diagnosis not present

## 2021-12-10 DIAGNOSIS — K5730 Diverticulosis of large intestine without perforation or abscess without bleeding: Secondary | ICD-10-CM | POA: Diagnosis not present

## 2021-12-10 DIAGNOSIS — K2930 Chronic superficial gastritis without bleeding: Secondary | ICD-10-CM | POA: Diagnosis not present

## 2021-12-10 DIAGNOSIS — Z09 Encounter for follow-up examination after completed treatment for conditions other than malignant neoplasm: Secondary | ICD-10-CM | POA: Diagnosis not present

## 2021-12-14 DIAGNOSIS — K2930 Chronic superficial gastritis without bleeding: Secondary | ICD-10-CM | POA: Diagnosis not present

## 2021-12-14 DIAGNOSIS — K529 Noninfective gastroenteritis and colitis, unspecified: Secondary | ICD-10-CM | POA: Diagnosis not present

## 2022-01-04 DIAGNOSIS — K639 Disease of intestine, unspecified: Secondary | ICD-10-CM | POA: Diagnosis not present

## 2022-01-20 DIAGNOSIS — D3131 Benign neoplasm of right choroid: Secondary | ICD-10-CM | POA: Diagnosis not present

## 2022-01-20 DIAGNOSIS — H47323 Drusen of optic disc, bilateral: Secondary | ICD-10-CM | POA: Diagnosis not present

## 2022-01-20 DIAGNOSIS — H401231 Low-tension glaucoma, bilateral, mild stage: Secondary | ICD-10-CM | POA: Diagnosis not present

## 2022-01-20 DIAGNOSIS — D3132 Benign neoplasm of left choroid: Secondary | ICD-10-CM | POA: Diagnosis not present

## 2022-01-20 DIAGNOSIS — H524 Presbyopia: Secondary | ICD-10-CM | POA: Diagnosis not present

## 2022-01-20 DIAGNOSIS — H26491 Other secondary cataract, right eye: Secondary | ICD-10-CM | POA: Diagnosis not present

## 2022-01-20 DIAGNOSIS — E119 Type 2 diabetes mellitus without complications: Secondary | ICD-10-CM | POA: Diagnosis not present

## 2022-02-15 ENCOUNTER — Other Ambulatory Visit: Payer: Self-pay | Admitting: Internal Medicine

## 2022-02-15 DIAGNOSIS — E041 Nontoxic single thyroid nodule: Secondary | ICD-10-CM

## 2022-02-17 ENCOUNTER — Ambulatory Visit
Admission: RE | Admit: 2022-02-17 | Discharge: 2022-02-17 | Disposition: A | Discharge status: Home / Self Care | Payer: Medicare Other | Source: Ambulatory Visit | Attending: Internal Medicine | Admitting: Internal Medicine

## 2022-02-17 DIAGNOSIS — E041 Nontoxic single thyroid nodule: Secondary | ICD-10-CM

## 2022-03-24 DIAGNOSIS — E039 Hypothyroidism, unspecified: Secondary | ICD-10-CM | POA: Diagnosis not present

## 2022-03-24 DIAGNOSIS — E559 Vitamin D deficiency, unspecified: Secondary | ICD-10-CM | POA: Diagnosis not present

## 2022-04-05 DIAGNOSIS — E210 Primary hyperparathyroidism: Secondary | ICD-10-CM | POA: Diagnosis not present

## 2022-04-05 DIAGNOSIS — M8589 Other specified disorders of bone density and structure, multiple sites: Secondary | ICD-10-CM | POA: Diagnosis not present

## 2022-04-05 DIAGNOSIS — E041 Nontoxic single thyroid nodule: Secondary | ICD-10-CM | POA: Diagnosis not present

## 2022-04-05 DIAGNOSIS — E559 Vitamin D deficiency, unspecified: Secondary | ICD-10-CM | POA: Diagnosis not present

## 2022-04-05 DIAGNOSIS — M8580 Other specified disorders of bone density and structure, unspecified site: Secondary | ICD-10-CM | POA: Diagnosis not present

## 2022-04-05 DIAGNOSIS — E039 Hypothyroidism, unspecified: Secondary | ICD-10-CM | POA: Diagnosis not present

## 2022-04-06 DIAGNOSIS — R062 Wheezing: Secondary | ICD-10-CM | POA: Diagnosis not present

## 2022-04-06 DIAGNOSIS — J208 Acute bronchitis due to other specified organisms: Secondary | ICD-10-CM | POA: Diagnosis not present

## 2022-04-06 DIAGNOSIS — R051 Acute cough: Secondary | ICD-10-CM | POA: Diagnosis not present

## 2022-04-09 DIAGNOSIS — R197 Diarrhea, unspecified: Secondary | ICD-10-CM | POA: Diagnosis not present

## 2022-04-12 ENCOUNTER — Other Ambulatory Visit: Payer: Self-pay | Admitting: Family Medicine

## 2022-04-12 DIAGNOSIS — Z1231 Encounter for screening mammogram for malignant neoplasm of breast: Secondary | ICD-10-CM

## 2022-04-28 DIAGNOSIS — M810 Age-related osteoporosis without current pathological fracture: Secondary | ICD-10-CM | POA: Diagnosis not present

## 2022-05-18 DIAGNOSIS — J069 Acute upper respiratory infection, unspecified: Secondary | ICD-10-CM | POA: Diagnosis not present

## 2022-05-18 DIAGNOSIS — R062 Wheezing: Secondary | ICD-10-CM | POA: Diagnosis not present

## 2022-05-18 DIAGNOSIS — R051 Acute cough: Secondary | ICD-10-CM | POA: Diagnosis not present

## 2022-05-18 DIAGNOSIS — J029 Acute pharyngitis, unspecified: Secondary | ICD-10-CM | POA: Diagnosis not present

## 2022-05-21 DIAGNOSIS — D1801 Hemangioma of skin and subcutaneous tissue: Secondary | ICD-10-CM | POA: Diagnosis not present

## 2022-05-21 DIAGNOSIS — D225 Melanocytic nevi of trunk: Secondary | ICD-10-CM | POA: Diagnosis not present

## 2022-05-21 DIAGNOSIS — D2272 Melanocytic nevi of left lower limb, including hip: Secondary | ICD-10-CM | POA: Diagnosis not present

## 2022-05-21 DIAGNOSIS — L821 Other seborrheic keratosis: Secondary | ICD-10-CM | POA: Diagnosis not present

## 2022-05-21 DIAGNOSIS — L738 Other specified follicular disorders: Secondary | ICD-10-CM | POA: Diagnosis not present

## 2022-05-21 DIAGNOSIS — D2271 Melanocytic nevi of right lower limb, including hip: Secondary | ICD-10-CM | POA: Diagnosis not present

## 2022-05-21 DIAGNOSIS — D2261 Melanocytic nevi of right upper limb, including shoulder: Secondary | ICD-10-CM | POA: Diagnosis not present

## 2022-05-21 DIAGNOSIS — D2239 Melanocytic nevi of other parts of face: Secondary | ICD-10-CM | POA: Diagnosis not present

## 2022-05-21 DIAGNOSIS — Z85828 Personal history of other malignant neoplasm of skin: Secondary | ICD-10-CM | POA: Diagnosis not present

## 2022-05-21 DIAGNOSIS — D2262 Melanocytic nevi of left upper limb, including shoulder: Secondary | ICD-10-CM | POA: Diagnosis not present

## 2022-05-27 ENCOUNTER — Other Ambulatory Visit: Payer: Self-pay | Admitting: Family Medicine

## 2022-05-27 ENCOUNTER — Ambulatory Visit
Admission: RE | Admit: 2022-05-27 | Discharge: 2022-05-27 | Disposition: A | Discharge status: Home / Self Care | Payer: Medicare Other | Source: Ambulatory Visit | Attending: Family Medicine | Admitting: Family Medicine

## 2022-05-27 DIAGNOSIS — J069 Acute upper respiratory infection, unspecified: Secondary | ICD-10-CM | POA: Diagnosis not present

## 2022-05-27 DIAGNOSIS — E1169 Type 2 diabetes mellitus with other specified complication: Secondary | ICD-10-CM | POA: Diagnosis not present

## 2022-05-27 DIAGNOSIS — Z09 Encounter for follow-up examination after completed treatment for conditions other than malignant neoplasm: Secondary | ICD-10-CM

## 2022-05-27 DIAGNOSIS — K219 Gastro-esophageal reflux disease without esophagitis: Secondary | ICD-10-CM | POA: Diagnosis not present

## 2022-05-27 DIAGNOSIS — Z79899 Other long term (current) drug therapy: Secondary | ICD-10-CM | POA: Diagnosis not present

## 2022-05-27 DIAGNOSIS — E039 Hypothyroidism, unspecified: Secondary | ICD-10-CM | POA: Diagnosis not present

## 2022-05-27 DIAGNOSIS — E119 Type 2 diabetes mellitus without complications: Secondary | ICD-10-CM | POA: Diagnosis not present

## 2022-05-27 DIAGNOSIS — J449 Chronic obstructive pulmonary disease, unspecified: Secondary | ICD-10-CM | POA: Diagnosis not present

## 2022-05-27 DIAGNOSIS — R059 Cough, unspecified: Secondary | ICD-10-CM | POA: Diagnosis not present

## 2022-05-27 DIAGNOSIS — E7800 Pure hypercholesterolemia, unspecified: Secondary | ICD-10-CM | POA: Diagnosis not present

## 2022-06-25 DIAGNOSIS — Z1231 Encounter for screening mammogram for malignant neoplasm of breast: Secondary | ICD-10-CM

## 2022-07-01 ENCOUNTER — Ambulatory Visit
Admission: RE | Admit: 2022-07-01 | Discharge: 2022-07-01 | Disposition: A | Discharge status: Home / Self Care | Payer: Medicare Other | Source: Ambulatory Visit | Attending: Family Medicine | Admitting: Family Medicine

## 2022-07-01 DIAGNOSIS — Z1231 Encounter for screening mammogram for malignant neoplasm of breast: Secondary | ICD-10-CM | POA: Diagnosis not present

## 2022-07-21 DIAGNOSIS — H401231 Low-tension glaucoma, bilateral, mild stage: Secondary | ICD-10-CM | POA: Diagnosis not present

## 2022-10-11 DIAGNOSIS — K639 Disease of intestine, unspecified: Secondary | ICD-10-CM | POA: Diagnosis not present

## 2022-10-11 DIAGNOSIS — K219 Gastro-esophageal reflux disease without esophagitis: Secondary | ICD-10-CM | POA: Diagnosis not present

## 2022-10-19 DIAGNOSIS — M65322 Trigger finger, left index finger: Secondary | ICD-10-CM | POA: Diagnosis not present

## 2022-11-18 DIAGNOSIS — M65322 Trigger finger, left index finger: Secondary | ICD-10-CM | POA: Diagnosis not present

## 2022-11-30 ENCOUNTER — Other Ambulatory Visit: Payer: Self-pay

## 2022-12-03 DIAGNOSIS — E7800 Pure hypercholesterolemia, unspecified: Secondary | ICD-10-CM | POA: Diagnosis not present

## 2022-12-03 DIAGNOSIS — E213 Hyperparathyroidism, unspecified: Secondary | ICD-10-CM | POA: Diagnosis not present

## 2022-12-03 DIAGNOSIS — K219 Gastro-esophageal reflux disease without esophagitis: Secondary | ICD-10-CM | POA: Diagnosis not present

## 2022-12-03 DIAGNOSIS — R42 Dizziness and giddiness: Secondary | ICD-10-CM | POA: Diagnosis not present

## 2022-12-03 DIAGNOSIS — E11319 Type 2 diabetes mellitus with unspecified diabetic retinopathy without macular edema: Secondary | ICD-10-CM | POA: Diagnosis not present

## 2022-12-03 DIAGNOSIS — I10 Essential (primary) hypertension: Secondary | ICD-10-CM | POA: Diagnosis not present

## 2022-12-03 DIAGNOSIS — Z0000 Encounter for general adult medical examination without abnormal findings: Secondary | ICD-10-CM | POA: Diagnosis not present

## 2022-12-03 DIAGNOSIS — Z79899 Other long term (current) drug therapy: Secondary | ICD-10-CM | POA: Diagnosis not present

## 2022-12-03 DIAGNOSIS — H401233 Low-tension glaucoma, bilateral, severe stage: Secondary | ICD-10-CM | POA: Diagnosis not present

## 2022-12-03 DIAGNOSIS — E039 Hypothyroidism, unspecified: Secondary | ICD-10-CM | POA: Diagnosis not present

## 2022-12-03 DIAGNOSIS — J449 Chronic obstructive pulmonary disease, unspecified: Secondary | ICD-10-CM | POA: Diagnosis not present

## 2023-01-20 DIAGNOSIS — H47323 Drusen of optic disc, bilateral: Secondary | ICD-10-CM | POA: Diagnosis not present

## 2023-01-20 DIAGNOSIS — H26493 Other secondary cataract, bilateral: Secondary | ICD-10-CM | POA: Diagnosis not present

## 2023-01-20 DIAGNOSIS — H401231 Low-tension glaucoma, bilateral, mild stage: Secondary | ICD-10-CM | POA: Diagnosis not present

## 2023-01-20 DIAGNOSIS — E119 Type 2 diabetes mellitus without complications: Secondary | ICD-10-CM | POA: Diagnosis not present

## 2023-02-11 ENCOUNTER — Other Ambulatory Visit (HOSPITAL_BASED_OUTPATIENT_CLINIC_OR_DEPARTMENT_OTHER): Payer: Self-pay

## 2023-02-11 MED ORDER — COVID-19 MRNA VAC-TRIS(PFIZER) 30 MCG/0.3ML IM SUSY
0.3000 mL | PREFILLED_SYRINGE | Freq: Once | INTRAMUSCULAR | 0 refills | Status: AC
  Filled 2023-02-11: qty 0.3, 1d supply, fill #0

## 2023-03-10 DIAGNOSIS — M65322 Trigger finger, left index finger: Secondary | ICD-10-CM | POA: Diagnosis not present

## 2023-03-17 DIAGNOSIS — M545 Low back pain, unspecified: Secondary | ICD-10-CM | POA: Diagnosis not present

## 2023-03-17 DIAGNOSIS — M5442 Lumbago with sciatica, left side: Secondary | ICD-10-CM | POA: Diagnosis not present

## 2023-03-18 ENCOUNTER — Other Ambulatory Visit: Payer: Self-pay | Admitting: Orthopedic Surgery

## 2023-03-18 DIAGNOSIS — M545 Low back pain, unspecified: Secondary | ICD-10-CM

## 2023-03-21 DIAGNOSIS — M5416 Radiculopathy, lumbar region: Secondary | ICD-10-CM | POA: Diagnosis not present

## 2023-03-23 DIAGNOSIS — M5416 Radiculopathy, lumbar region: Secondary | ICD-10-CM | POA: Diagnosis not present

## 2023-03-28 DIAGNOSIS — M5416 Radiculopathy, lumbar region: Secondary | ICD-10-CM | POA: Diagnosis not present

## 2023-03-29 DIAGNOSIS — E559 Vitamin D deficiency, unspecified: Secondary | ICD-10-CM | POA: Diagnosis not present

## 2023-03-29 DIAGNOSIS — E210 Primary hyperparathyroidism: Secondary | ICD-10-CM | POA: Diagnosis not present

## 2023-03-30 DIAGNOSIS — M5416 Radiculopathy, lumbar region: Secondary | ICD-10-CM | POA: Diagnosis not present

## 2023-03-31 ENCOUNTER — Ambulatory Visit
Admission: RE | Admit: 2023-03-31 | Discharge: 2023-03-31 | Disposition: A | Discharge status: Home / Self Care | Payer: Medicare Other | Source: Ambulatory Visit | Attending: Orthopedic Surgery | Admitting: Orthopedic Surgery

## 2023-03-31 DIAGNOSIS — M4726 Other spondylosis with radiculopathy, lumbar region: Secondary | ICD-10-CM | POA: Diagnosis not present

## 2023-03-31 DIAGNOSIS — M5416 Radiculopathy, lumbar region: Secondary | ICD-10-CM | POA: Diagnosis not present

## 2023-03-31 DIAGNOSIS — M48061 Spinal stenosis, lumbar region without neurogenic claudication: Secondary | ICD-10-CM | POA: Diagnosis not present

## 2023-03-31 DIAGNOSIS — M545 Low back pain, unspecified: Secondary | ICD-10-CM

## 2023-04-04 DIAGNOSIS — M5416 Radiculopathy, lumbar region: Secondary | ICD-10-CM | POA: Diagnosis not present

## 2023-04-05 ENCOUNTER — Other Ambulatory Visit: Payer: Self-pay | Admitting: Nurse Practitioner

## 2023-04-05 DIAGNOSIS — E119 Type 2 diabetes mellitus without complications: Secondary | ICD-10-CM | POA: Diagnosis not present

## 2023-04-05 DIAGNOSIS — E041 Nontoxic single thyroid nodule: Secondary | ICD-10-CM | POA: Diagnosis not present

## 2023-04-05 DIAGNOSIS — E039 Hypothyroidism, unspecified: Secondary | ICD-10-CM | POA: Diagnosis not present

## 2023-04-05 DIAGNOSIS — M810 Age-related osteoporosis without current pathological fracture: Secondary | ICD-10-CM | POA: Diagnosis not present

## 2023-04-06 ENCOUNTER — Inpatient Hospital Stay
Admission: RE | Admit: 2023-04-06 | Discharge: 2023-04-06 | Discharge status: Home / Self Care | Payer: Medicare Other | Source: Ambulatory Visit | Attending: Nurse Practitioner

## 2023-04-06 DIAGNOSIS — E041 Nontoxic single thyroid nodule: Secondary | ICD-10-CM

## 2023-04-06 DIAGNOSIS — M5416 Radiculopathy, lumbar region: Secondary | ICD-10-CM | POA: Diagnosis not present

## 2023-04-12 DIAGNOSIS — M65322 Trigger finger, left index finger: Secondary | ICD-10-CM | POA: Diagnosis not present

## 2023-04-12 DIAGNOSIS — M5416 Radiculopathy, lumbar region: Secondary | ICD-10-CM | POA: Diagnosis not present

## 2023-04-14 DIAGNOSIS — M5416 Radiculopathy, lumbar region: Secondary | ICD-10-CM | POA: Diagnosis not present

## 2023-04-18 DIAGNOSIS — M5416 Radiculopathy, lumbar region: Secondary | ICD-10-CM | POA: Diagnosis not present

## 2023-04-20 DIAGNOSIS — M5442 Lumbago with sciatica, left side: Secondary | ICD-10-CM | POA: Diagnosis not present

## 2023-04-25 DIAGNOSIS — M5416 Radiculopathy, lumbar region: Secondary | ICD-10-CM | POA: Diagnosis not present

## 2023-04-26 DIAGNOSIS — K219 Gastro-esophageal reflux disease without esophagitis: Secondary | ICD-10-CM | POA: Diagnosis not present

## 2023-04-26 DIAGNOSIS — K639 Disease of intestine, unspecified: Secondary | ICD-10-CM | POA: Diagnosis not present

## 2023-04-26 DIAGNOSIS — Z860100 Personal history of colon polyps, unspecified: Secondary | ICD-10-CM | POA: Diagnosis not present

## 2023-04-27 DIAGNOSIS — M5416 Radiculopathy, lumbar region: Secondary | ICD-10-CM | POA: Diagnosis not present

## 2023-04-29 DIAGNOSIS — M810 Age-related osteoporosis without current pathological fracture: Secondary | ICD-10-CM | POA: Diagnosis not present

## 2023-05-05 ENCOUNTER — Other Ambulatory Visit: Payer: Self-pay | Admitting: Family Medicine

## 2023-05-05 DIAGNOSIS — Z1231 Encounter for screening mammogram for malignant neoplasm of breast: Secondary | ICD-10-CM

## 2023-05-05 DIAGNOSIS — M5416 Radiculopathy, lumbar region: Secondary | ICD-10-CM | POA: Diagnosis not present

## 2023-05-09 DIAGNOSIS — M5416 Radiculopathy, lumbar region: Secondary | ICD-10-CM | POA: Diagnosis not present

## 2023-05-20 DIAGNOSIS — J069 Acute upper respiratory infection, unspecified: Secondary | ICD-10-CM | POA: Diagnosis not present

## 2023-05-20 DIAGNOSIS — K219 Gastro-esophageal reflux disease without esophagitis: Secondary | ICD-10-CM | POA: Diagnosis not present

## 2023-05-20 DIAGNOSIS — E11319 Type 2 diabetes mellitus with unspecified diabetic retinopathy without macular edema: Secondary | ICD-10-CM | POA: Diagnosis not present

## 2023-05-20 DIAGNOSIS — E1169 Type 2 diabetes mellitus with other specified complication: Secondary | ICD-10-CM | POA: Diagnosis not present

## 2023-05-20 DIAGNOSIS — M810 Age-related osteoporosis without current pathological fracture: Secondary | ICD-10-CM | POA: Diagnosis not present

## 2023-05-20 DIAGNOSIS — E7800 Pure hypercholesterolemia, unspecified: Secondary | ICD-10-CM | POA: Diagnosis not present

## 2023-05-20 DIAGNOSIS — R42 Dizziness and giddiness: Secondary | ICD-10-CM | POA: Diagnosis not present

## 2023-05-20 DIAGNOSIS — I10 Essential (primary) hypertension: Secondary | ICD-10-CM | POA: Diagnosis not present

## 2023-05-20 DIAGNOSIS — E039 Hypothyroidism, unspecified: Secondary | ICD-10-CM | POA: Diagnosis not present

## 2023-05-20 DIAGNOSIS — J449 Chronic obstructive pulmonary disease, unspecified: Secondary | ICD-10-CM | POA: Diagnosis not present

## 2023-05-20 DIAGNOSIS — H401233 Low-tension glaucoma, bilateral, severe stage: Secondary | ICD-10-CM | POA: Diagnosis not present

## 2023-05-31 DIAGNOSIS — D485 Neoplasm of uncertain behavior of skin: Secondary | ICD-10-CM | POA: Diagnosis not present

## 2023-05-31 DIAGNOSIS — D2261 Melanocytic nevi of right upper limb, including shoulder: Secondary | ICD-10-CM | POA: Diagnosis not present

## 2023-05-31 DIAGNOSIS — L929 Granulomatous disorder of the skin and subcutaneous tissue, unspecified: Secondary | ICD-10-CM | POA: Diagnosis not present

## 2023-05-31 DIAGNOSIS — L821 Other seborrheic keratosis: Secondary | ICD-10-CM | POA: Diagnosis not present

## 2023-05-31 DIAGNOSIS — D2271 Melanocytic nevi of right lower limb, including hip: Secondary | ICD-10-CM | POA: Diagnosis not present

## 2023-05-31 DIAGNOSIS — D2262 Melanocytic nevi of left upper limb, including shoulder: Secondary | ICD-10-CM | POA: Diagnosis not present

## 2023-05-31 DIAGNOSIS — D225 Melanocytic nevi of trunk: Secondary | ICD-10-CM | POA: Diagnosis not present

## 2023-05-31 DIAGNOSIS — D1801 Hemangioma of skin and subcutaneous tissue: Secondary | ICD-10-CM | POA: Diagnosis not present

## 2023-05-31 DIAGNOSIS — Z85828 Personal history of other malignant neoplasm of skin: Secondary | ICD-10-CM | POA: Diagnosis not present

## 2023-06-28 DIAGNOSIS — Z1231 Encounter for screening mammogram for malignant neoplasm of breast: Secondary | ICD-10-CM

## 2023-07-04 ENCOUNTER — Ambulatory Visit
Admission: RE | Admit: 2023-07-04 | Discharge: 2023-07-04 | Disposition: A | Discharge status: Home / Self Care | Payer: Medicare Other | Source: Ambulatory Visit

## 2023-07-04 ENCOUNTER — Encounter (HOSPITAL_COMMUNITY): Payer: Medicare Other

## 2023-07-04 DIAGNOSIS — Z1231 Encounter for screening mammogram for malignant neoplasm of breast: Secondary | ICD-10-CM

## 2023-07-05 ENCOUNTER — Ambulatory Visit: Payer: Medicare Other | Admitting: Primary Care

## 2023-07-05 ENCOUNTER — Encounter: Payer: Self-pay | Admitting: Primary Care

## 2023-07-05 VITALS — BP 134/70 | HR 78 | Temp 98.4°F | Ht 63.5 in | Wt 231.2 lb

## 2023-07-05 DIAGNOSIS — G4734 Idiopathic sleep related nonobstructive alveolar hypoventilation: Secondary | ICD-10-CM | POA: Diagnosis not present

## 2023-07-05 NOTE — Patient Instructions (Addendum)
 -  NOCTURNAL HYPOXEMIA: Nocturnal hypoxemia means having low oxygen levels in the blood during the night. Your last test showed an average oxygen level of 93% with a low of 81%, and you spent 14 minutes with oxygen levels below 88%. Since you have no symptoms like gasping, choking, or disrupted sleep, we will continue without supplemental oxygen as long as your daytime oxygen levels remain normal. We performed a walk test today to assess your daytime oxygen needs.  -WEIGHT GAIN: You have gained approximately 6 pounds, bringing your weight to around 225 pounds. Weight loss has previously helped improve your nocturnal hypoxemia. We have set a weight loss goal of 10 pounds to potentially improve your sleep and nocturnal hypoxemia.  Orders: Ambulatory walk test on forehead probe  INSTRUCTIONS: Please follow up as needed to monitor your progress with weight loss and to reassess your oxygen levels if any symptoms develop. Continue with your current management for vertigo and upper respiratory infections.

## 2023-07-05 NOTE — Progress Notes (Signed)
 @Patient  ID: [name], female    DOB: 04-14-1951, 73 y.o.   MRN: [id number]  Chief Complaint  Patient presents with   Follow-up    Referring provider: Clayborn Heron, MD  HPI: 73 year old female, never smoked. PMH significant for chronic respiratory failure, diabetes mellitus, hyperlipidemia, obesity.   Previous [facility]   09/09/20- Dr. Craige Cotta  She had several questions about her set - POC versus stationary, continuous flow versus pulsed, CPAP/Bipap versus supplemental oxygen.   She purchased a POC on her own and this has been working.  She still gets supplies from [employer], but this is no longer in her insurance plan.  She is paying about $150 per month for the stationary concentrator and tanks, but not using these.   Nocturnal hypoxemia with obesity hypoventilation syndrome and mild COPD/asthma. - continue 2 liters oxygen at night - she will use her portable oxygen concentrator - will assess at her next visit if she can change from [employer] to another DME to get supplies set up - will have [employer] discontinue her stationary oxygen concentrator and tanks   07/05/2023 Discussed the use of AI scribe software for clinical note transcription with the patient, who gave verbal consent to proceed.  History of Present Illness   [name] is a 73 year old female with nocturnal hypoxemia who presents for follow-up evaluation.  She has a history of nocturnal hypoxemia and has undergone several nocturnal oximetry tests, with the most recent in March 2023. During this test, her average oxygen saturation was 93%, with a minimum of 81%, and she spent 14 minutes with oxygen levels below 88%. Per Dr. Craige Cotta patient no longer needs supplemental oxygen at night.She is asymptomatic, experiencing no gasping, choking, disrupted sleep, or daytime fatigue. Her oxygen was discontinued as it was deemed borderline, and she has not experienced any significant issues since then.  No current  nighttime symptoms except for waking once per night to use the bathroom. No gasping or choking at night and no difficulty waking up in the morning. No unexpected daytime sleepiness.  No other respiratory symptoms and no abnormal leg swelling, noting only occasional swelling if she consumes a lot of salt. She uses a nebulizer with Butisol during upper respiratory infections, which occur when she has a cold.  She mentions a recent weight gain of approximately six pounds, bringing her weight to around 225 pounds. She attributes this to the weather and notes that she had previously lost weight, which was associated with an improvement in her hypoxemia.  Occasional vertigo, for which she takes an antihistamine at half the usual dose to avoid grogginess.     Sleep testing: HST 11/06/16 >> AHI 2.8, SpO2 low 85% ONO with RA 11/08/16 >> Average SpO2 88%, low SpO2 73%, Spent 5 hrs 7 min with SpO2 < 88% PSG 02/17/17 >> AHI 2.2, SpO2 low 80% ONO with RA 12/01/17 >> test time 7 hrs 54 min.  Baseline SpO2 90%, low SpO2 82%.  Spent 2 hrs 4 min with SpO2 < 88%. ONO with RA 02/23/18 >> test time 8 hrs 1 min.  Baseline SpO2 90%, low SpO2 79%.  Spent 2 hrs 1 min with SpO2 < 88%. ONO with RA 03/23/19 >> test time 16 hrs 3 min.  Baseline SpO2 89%, low SpO2 66%.  Spent 2 hrs 58 min with SpO2 < 88% ONO with RA 01/02/20 >> test time 15 hrs 58 min.  Baseline SpO2 88%, low SpO2 70%.  Spent 8 hrs 57 min with SpO2 < 88%.   Allergies  Allergen Reactions   Ciprofloxacin Other (See Comments)    Dizziness/vertigo   Sulfa Antibiotics Swelling   Sulfasalazine Swelling    Immunization History  Administered Date(s) Administered   Influenza Split 02/21/2017   Influenza, High Dose Seasonal PF 03/07/2017, 02/13/2018, 02/27/2019, 01/29/2020   PFIZER(Purple Top)SARS-COV-2 Vaccination 05/19/2019, 06/09/2019, 02/28/2020   Pfizer Covid-19 Vaccine Bivalent Booster 76yrs & up 02/05/2021   Pfizer(Comirnaty)Fall Seasonal Vaccine 12  years and older 02/11/2023   Zoster Recombinant(Shingrix) 12/02/2017, 03/06/2018    Past Medical History:  Diagnosis Date   Abnormal EKG 10/06/2016   Abnormal nuclear stress test 10/28/2016   Asthma    Atypical chest pain    DJD (degenerative joint disease)    DM (diabetes mellitus) (HCC) 10/28/2016   Family history of early CAD 10/06/2016   GERD (gastroesophageal reflux disease)    H/O squamous cell carcinoma    NOSE DR. [name]   Hyperlipidemia    Hypothyroidism    Morbid obesity (HCC)    Nocturnal hypoxemia 08/10/2017   Osteopenia    Severe stage glaucoma    DR. [name] S/P LASER Rx   Thyroid nodule    BENIGN    Tobacco History: Social History   Tobacco Use  Smoking Status Never  Smokeless Tobacco Never   Counseling given: Not Answered   Outpatient Medications Prior to Visit  Medication Sig Dispense Refill   carboxymethylcellulose (REFRESH PLUS) 0.5 % SOLN Place 1 drop into both eyes 3 (three) times daily as needed (for dry eyes).     cetirizine (ZYRTEC) 10 MG tablet Take 10 mg by mouth at bedtime.     Cholecalciferol (VITAMIN D-3) 5000 units TABS Take 5,000 Units by mouth every Monday, Wednesday, and Friday.     COVID-19 mRNA bivalent vaccine, Pfizer, (PFIZER COVID-19 VAC BIVALENT) injection Inject into the muscle. 0.3 mL 0   diclofenac sodium (VOLTAREN) 1 % GEL Apply 1-2 g topically 3 (three) times daily as needed (for knee pain.).     empagliflozin (JARDIANCE) 10 MG TABS tablet Take 10 mg by mouth daily.     glimepiride (AMARYL) 4 MG tablet Take 4 mg by mouth 2 (two) times daily.      ibuprofen (ADVIL,MOTRIN) 200 MG tablet Take 400-800 mg by mouth every 8 (eight) hours as needed for headache or moderate pain.      influenza vaccine adjuvanted (FLUAD) 0.5 ML injection Inject into the muscle. 0.5 mL 0   lisinopril (ZESTRIL) 2.5 MG tablet      metFORMIN (GLUCOPHAGE) 500 MG tablet Take 1,000 mg by mouth 2 (two) times daily with a meal.      Multiple Vitamin  (MULTIVITAMIN WITH MINERALS) TABS Take 1 tablet by mouth at bedtime. CENTRUM     simvastatin (ZOCOR) 40 MG tablet Take 40 mg by mouth every Monday, Wednesday, and Friday at 8 PM.     SYNTHROID 125 MCG tablet      esomeprazole (NEXIUM) 20 MG capsule Take 20 mg by mouth daily as needed (for acid reflux/indigestion).      No facility-administered medications prior to visit.    Review of Systems  Review of Systems  Constitutional: Negative.   HENT: Negative.    Respiratory: Negative.  Negative for cough, shortness of breath and wheezing.   Cardiovascular: Negative.      Physical Exam  BP 134/70 (BP Location: Right Arm, Patient Position: Sitting, Cuff Size: Large)   Pulse 78  Temp 98.4 F (36.9 C) (Oral)   Ht 5' 3.5" (1.613 m)   Wt 231 lb 3.2 oz (104.9 kg)   SpO2 98%   BMI 40.31 kg/m  Physical Exam Constitutional:      General: She is not in acute distress.    Appearance: Normal appearance. She is not ill-appearing.  HENT:     Head: Normocephalic and atraumatic.  Cardiovascular:     Rate and Rhythm: Normal rate and regular rhythm.  Pulmonary:     Effort: Pulmonary effort is normal.     Breath sounds: Normal breath sounds.     Comments: CTA Musculoskeletal:        General: Normal range of motion.  Skin:    General: Skin is warm and dry.  Neurological:     General: No focal deficit present.     Mental Status: She is alert and oriented to person, place, and time. Mental status is at baseline.  Psychiatric:        Mood and Affect: Mood normal.        Behavior: Behavior normal.        Thought Content: Thought content normal.        Judgment: Judgment normal.      Lab Results:  CBC    Component Value Date/Time   WBC 8.1 08/19/2017 1033   RBC 4.98 08/19/2017 1033   HGB 14.2 08/19/2017 1033   HCT 43.8 08/19/2017 1033   PLT 269 08/19/2017 1033   MCV 88 08/19/2017 1033   MCH 28.5 08/19/2017 1033   MCHC 32.4 08/19/2017 1033   RDW 14.2 08/19/2017 1033   LYMPHSABS  4.1 (H) 10/28/2016 1506   EOSABS 0.3 10/28/2016 1506   BASOSABS 0.0 10/28/2016 1506    BMET    Component Value Date/Time   NA 137 01/06/2018 1134   NA 140 08/19/2017 1033   K 4.5 01/06/2018 1134   CL 102 01/06/2018 1134   CO2 31 01/06/2018 1134   GLUCOSE 168 (H) 01/06/2018 1134   BUN 13 01/06/2018 1134   BUN 16 08/19/2017 1033   CREATININE 0.59 01/06/2018 1134   CALCIUM 10.3 01/06/2018 1134   GFRNONAA 94 08/19/2017 1033   GFRAA 108 08/19/2017 1033    BNP No results found for: "BNP"  ProBNP No results found for: "PROBNP"  Imaging: No results found.   Assessment & Plan:   1. Nocturnal hypoxemia (Primary)  2. Morbid obesity (HCC)     Nocturnal Hypoxemia Last oximetry test in 2023 showed average oxygen level of 93% with a low of 81%. Patient spent 14 minutes with oxygen level less than 88%. No current symptoms of gasping, choking, disrupted sleep, or daytime sleepiness. No daytime respiratory symptoms or abnormal leg swelling. -Plan to assess daytime oxygen need with a walk test today. -Continue current management without supplemental oxygen as long as daytime oxygen levels remain normal.  Vertigo Managed with half-dose of antihistamine due to grogginess with full dose. -Continue current management.  Weight Gain Patient has gained approximately 6 pounds, currently around 225 pounds. Weight loss previously associated with improvement in nocturnal hypoxemia. -Set weight loss goal of 10-15 pounds to potentially improve sleep and nocturnal hypoxemia.       Glenford Bayley, NP 07/05/2023

## 2023-07-29 DIAGNOSIS — M5416 Radiculopathy, lumbar region: Secondary | ICD-10-CM | POA: Diagnosis not present

## 2023-08-02 DIAGNOSIS — H401230 Low-tension glaucoma, bilateral, stage unspecified: Secondary | ICD-10-CM | POA: Diagnosis not present

## 2023-08-04 DIAGNOSIS — M5416 Radiculopathy, lumbar region: Secondary | ICD-10-CM | POA: Diagnosis not present

## 2023-08-29 DIAGNOSIS — M5416 Radiculopathy, lumbar region: Secondary | ICD-10-CM | POA: Diagnosis not present

## 2023-09-07 DIAGNOSIS — H401230 Low-tension glaucoma, bilateral, stage unspecified: Secondary | ICD-10-CM | POA: Diagnosis not present

## 2023-11-15 DIAGNOSIS — H1131 Conjunctival hemorrhage, right eye: Secondary | ICD-10-CM | POA: Diagnosis not present

## 2023-11-15 DIAGNOSIS — H04123 Dry eye syndrome of bilateral lacrimal glands: Secondary | ICD-10-CM | POA: Diagnosis not present

## 2024-01-20 DIAGNOSIS — H26493 Other secondary cataract, bilateral: Secondary | ICD-10-CM | POA: Diagnosis not present

## 2024-01-20 DIAGNOSIS — H524 Presbyopia: Secondary | ICD-10-CM | POA: Diagnosis not present

## 2024-01-20 DIAGNOSIS — H401230 Low-tension glaucoma, bilateral, stage unspecified: Secondary | ICD-10-CM | POA: Diagnosis not present

## 2024-01-20 DIAGNOSIS — H47323 Drusen of optic disc, bilateral: Secondary | ICD-10-CM | POA: Diagnosis not present

## 2024-01-20 DIAGNOSIS — E119 Type 2 diabetes mellitus without complications: Secondary | ICD-10-CM | POA: Diagnosis not present

## 2024-02-10 DIAGNOSIS — J449 Chronic obstructive pulmonary disease, unspecified: Secondary | ICD-10-CM | POA: Diagnosis not present

## 2024-02-10 DIAGNOSIS — M810 Age-related osteoporosis without current pathological fracture: Secondary | ICD-10-CM | POA: Diagnosis not present

## 2024-02-10 DIAGNOSIS — Z23 Encounter for immunization: Secondary | ICD-10-CM | POA: Diagnosis not present

## 2024-02-10 DIAGNOSIS — H35033 Hypertensive retinopathy, bilateral: Secondary | ICD-10-CM | POA: Diagnosis not present

## 2024-03-07 DIAGNOSIS — Z0000 Encounter for general adult medical examination without abnormal findings: Secondary | ICD-10-CM | POA: Diagnosis not present

## 2024-03-07 DIAGNOSIS — J449 Chronic obstructive pulmonary disease, unspecified: Secondary | ICD-10-CM | POA: Diagnosis not present

## 2024-03-07 DIAGNOSIS — E1139 Type 2 diabetes mellitus with other diabetic ophthalmic complication: Secondary | ICD-10-CM | POA: Diagnosis not present

## 2024-03-07 DIAGNOSIS — E039 Hypothyroidism, unspecified: Secondary | ICD-10-CM | POA: Diagnosis not present

## 2024-03-07 DIAGNOSIS — M810 Age-related osteoporosis without current pathological fracture: Secondary | ICD-10-CM | POA: Diagnosis not present

## 2024-03-07 DIAGNOSIS — Z79899 Other long term (current) drug therapy: Secondary | ICD-10-CM | POA: Diagnosis not present

## 2024-03-07 DIAGNOSIS — E7800 Pure hypercholesterolemia, unspecified: Secondary | ICD-10-CM | POA: Diagnosis not present

## 2024-03-07 DIAGNOSIS — H42 Glaucoma in diseases classified elsewhere: Secondary | ICD-10-CM | POA: Diagnosis not present

## 2024-03-07 DIAGNOSIS — Z23 Encounter for immunization: Secondary | ICD-10-CM | POA: Diagnosis not present

## 2024-03-07 DIAGNOSIS — K219 Gastro-esophageal reflux disease without esophagitis: Secondary | ICD-10-CM | POA: Diagnosis not present

## 2024-03-20 ENCOUNTER — Other Ambulatory Visit: Payer: Self-pay | Admitting: Nurse Practitioner

## 2024-03-20 DIAGNOSIS — E041 Nontoxic single thyroid nodule: Secondary | ICD-10-CM

## 2024-03-26 ENCOUNTER — Ambulatory Visit
Admission: RE | Admit: 2024-03-26 | Discharge: 2024-03-26 | Disposition: A | Source: Ambulatory Visit | Attending: Nurse Practitioner

## 2024-03-26 DIAGNOSIS — E041 Nontoxic single thyroid nodule: Secondary | ICD-10-CM

## 2024-04-12 ENCOUNTER — Other Ambulatory Visit: Payer: Self-pay | Admitting: Nurse Practitioner

## 2024-04-12 DIAGNOSIS — E041 Nontoxic single thyroid nodule: Secondary | ICD-10-CM

## 2024-06-06 ENCOUNTER — Other Ambulatory Visit: Payer: Self-pay | Admitting: Family Medicine

## 2024-06-06 DIAGNOSIS — Z1231 Encounter for screening mammogram for malignant neoplasm of breast: Secondary | ICD-10-CM

## 2024-07-04 ENCOUNTER — Ambulatory Visit

## 2024-07-04 DIAGNOSIS — Z1231 Encounter for screening mammogram for malignant neoplasm of breast: Secondary | ICD-10-CM
